# Patient Record
Sex: Male | Born: 1940 | Race: White | Hispanic: No | Marital: Married | Smoking: Former smoker
Health system: Southern US, Community
[De-identification: ages and names within clinical notes are randomized; demographics above are authoritative.]

## PROBLEM LIST (undated history)

## (undated) DIAGNOSIS — I10 Essential (primary) hypertension: Secondary | ICD-10-CM

## (undated) DIAGNOSIS — Z87442 Personal history of urinary calculi: Secondary | ICD-10-CM

## (undated) DIAGNOSIS — C801 Malignant (primary) neoplasm, unspecified: Secondary | ICD-10-CM

## (undated) DIAGNOSIS — M1990 Unspecified osteoarthritis, unspecified site: Secondary | ICD-10-CM

## (undated) DIAGNOSIS — E785 Hyperlipidemia, unspecified: Secondary | ICD-10-CM

## (undated) HISTORY — DX: Hyperlipidemia, unspecified: E78.5

## (undated) HISTORY — DX: Essential (primary) hypertension: I10

## (undated) HISTORY — PX: OTHER SURGICAL HISTORY: SHX169

---

## 1998-01-22 HISTORY — PX: BACK SURGERY: SHX140

## 1998-01-22 HISTORY — PX: HERNIA REPAIR: SHX51

## 2001-05-15 ENCOUNTER — Encounter: Payer: Self-pay | Admitting: Emergency Medicine

## 2001-05-15 ENCOUNTER — Emergency Department (HOSPITAL_COMMUNITY): Admission: EM | Admit: 2001-05-15 | Discharge: 2001-05-15 | Payer: Self-pay | Admitting: Emergency Medicine

## 2005-06-06 ENCOUNTER — Ambulatory Visit (HOSPITAL_COMMUNITY): Admission: RE | Admit: 2005-06-06 | Discharge: 2005-06-06 | Payer: Self-pay | Admitting: Urology

## 2005-06-07 ENCOUNTER — Encounter (INDEPENDENT_AMBULATORY_CARE_PROVIDER_SITE_OTHER): Payer: Self-pay | Admitting: Specialist

## 2005-06-07 ENCOUNTER — Inpatient Hospital Stay (HOSPITAL_COMMUNITY): Admission: RE | Admit: 2005-06-07 | Discharge: 2005-06-08 | Payer: Self-pay | Admitting: Urology

## 2006-01-22 DIAGNOSIS — C801 Malignant (primary) neoplasm, unspecified: Secondary | ICD-10-CM

## 2006-01-22 HISTORY — DX: Malignant (primary) neoplasm, unspecified: C80.1

## 2006-01-22 HISTORY — PX: PROSTATECTOMY: SHX69

## 2006-09-02 ENCOUNTER — Emergency Department (HOSPITAL_COMMUNITY): Admission: EM | Admit: 2006-09-02 | Discharge: 2006-09-02 | Payer: Self-pay | Admitting: Emergency Medicine

## 2007-04-02 ENCOUNTER — Ambulatory Visit: Payer: Self-pay | Admitting: Internal Medicine

## 2007-04-28 ENCOUNTER — Ambulatory Visit: Payer: Self-pay | Admitting: Internal Medicine

## 2007-04-28 ENCOUNTER — Encounter: Payer: Self-pay | Admitting: Internal Medicine

## 2008-10-12 ENCOUNTER — Emergency Department (HOSPITAL_COMMUNITY): Admission: EM | Admit: 2008-10-12 | Discharge: 2008-10-12 | Payer: Self-pay | Admitting: Emergency Medicine

## 2010-04-28 LAB — POCT I-STAT, CHEM 8
BUN: 20 mg/dL (ref 6–23)
Creatinine, Ser: 0.9 mg/dL (ref 0.4–1.5)
Sodium: 139 mEq/L (ref 135–145)
TCO2: 21 mmol/L (ref 0–100)

## 2010-04-28 LAB — DIFFERENTIAL
Eosinophils Relative: 0 % (ref 0–5)
Lymphocytes Relative: 8 % — ABNORMAL LOW (ref 12–46)
Lymphs Abs: 0.8 10*3/uL (ref 0.7–4.0)
Monocytes Relative: 3 % (ref 3–12)

## 2010-04-28 LAB — URINALYSIS, ROUTINE W REFLEX MICROSCOPIC
Glucose, UA: NEGATIVE mg/dL
Protein, ur: NEGATIVE mg/dL
Specific Gravity, Urine: 1.017 (ref 1.005–1.030)
pH: 7.5 (ref 5.0–8.0)

## 2010-04-28 LAB — CBC
HCT: 44.1 % (ref 39.0–52.0)
Hemoglobin: 15.4 g/dL (ref 13.0–17.0)
Platelets: 167 10*3/uL (ref 150–400)
RBC: 4.48 MIL/uL (ref 4.22–5.81)
WBC: 10.5 10*3/uL (ref 4.0–10.5)

## 2010-06-09 NOTE — Op Note (Signed)
NAMEMARCK, MCCLENNY NO.:  [id number]   MEDICAL RECORD NO.:  [id number]          PATIENT TYPE:  INP   LOCATION:  1421                         FACILITY:  [facility]   PHYSICIAN:  Heloise Purpura, MD      DATE OF BIRTH:  12-Apr-1940   DATE OF PROCEDURE:  06/07/2005  DATE OF DISCHARGE:  06/06/2005                                 OPERATIVE REPORT   PREOPERATIVE DIAGNOSIS:  Clinically localized adenocarcinoma of prostate.   POSTOPERATIVE DIAGNOSIS:  Clinically localized adenocarcinoma of prostate.   PROCEDURE:  Robotic assisted laparoscopic radical prostatectomy (bilateral  nerve sparing).   SURGEON:  Dr. Heloise Purpura.   ASSISTANT:  Dr. Garrison Columbus.   COMPLICATIONS:  None.   ANESTHESIA:  General.   ESTIMATED BLOOD LOSS:  Less than 100 mL.   INTRAVENOUS FLUIDS:  1700 mL of lactated Ringer's.   SPECIMEN:  Prostate and seminal vesicles.   DRAINS:  1.  20-French coude catheter.  2.  #19 Blake pelvic drain.   INDICATIONS:  Mr. Stauffer is a 70 year old gentleman with recently diagnosed  clinical stage T1C prostate cancer with a Gleason score of 3+3=6 and a PSA  of 5.15.  His preoperative IPSS score was 2 with an IIEF score of 24.  After  discussing management options for clinically localized prostate cancer, the  patient elected to proceed with the above procedures.  Potential risks and  benefits were discussed with the patient and he consented.   DESCRIPTION OF PROCEDURE:  The patient was taken to the operating room and a  general anesthetic was administered.  He was given preoperative antibiotics,  placed in the dorsal lithotomy position, and prepped and draped in the usual  sterile fashion.  Next a preoperative time-out was performed.  A Foley  catheter was then inserted into the bladder.  A site was selected 18 cm from  the pubic symphysis and just the left of the umbilicus for placement of the  camera port.  This was placed using a standard open Hasson  technique.  This  allowed entry into the peritoneal cavity under direct vision.  A 12 mm port  was then placed and a pneumoperitoneum was established.  A 0 degrees lens  was then used to inspect the abdomen.  There were noted to be no intra-  abdominal injuries.  There were noted to be left lower quadrant adhesions.  Attention then turned to placement of the remaining ports.  Bilateral 8 mm  robotic ports were placed 16 cm from the pubic symphysis and 10 cm lateral  to the camera port.  An additional 8 mm robotic port was placed in the far  left lateral abdominal wall.  5 mm port was placed between the camera port  and the right robotic port.  An additional 12 mm port was placed in the far  right lateral abdominal wall.  All ports were placed under direct vision  without difficulty.  The laparoscopic scissors were then used to take down  the aforementioned left lower quadrant adhesions.  With the aid of the  cautery scissors, and  the bladder was then reflected posteriorly allowing  entry into the space of Retzius and identification of the endopelvic fascia  and prostate.  The endopelvic fascia was then incised the apex back to the  base of the prostate and the underlying levator muscle fibers were swept  laterally off the prostate.  This isolated the dorsal venous complex which  was then ligated with a figure-of-eight 0 Vicryl suture.  In addition, a 0  Vicryl buttressing suture was placed to buttress the dorsal venous complex  up to the pubic symphysis.  The bladder neck was then identified with the  aid of Foley catheter manipulation.  The anterior bladder neck was then  incised and the Foley catheter balloon was deflated allowing the catheter to  be brought into the operative field and used to retract the prostate  anteriorly.  This exposed the posterior bladder neck which was then  similarly divided.  Dissection continued posteriorly until the vasa  deferentia and seminal vesicles were  identified.  The vasa deferentia were  isolated and divided.  The seminal vesicles were isolated with care to clip  the seminal vesicle artery and avoid energy at the tips of the seminal  vesicles.  The seminal vesicles were then lifted anteriorly and the space  between Denonvilliers's fascia and the anterior rectum was bluntly developed  thereby isolating the vascular pedicles of the prostate.  Attention then  turned to the nerve sparing portion of the procedure.  The levator fascia  and lateral prostatic fascia were incised allowing the neurovascular bundles  to be swept laterally and posteriorly off the prostate.  This isolated the  vascular pedicles of the prostate which were then ligated with Hem-o-lok  clips and sharply divided.  The neurovascular bundles were then able to be  separated from the prostate out to the apex.  Attention then turned to the  apex of the prostate.  The dorsal venous complex was divided down to the  urethra.  The urethra was then sharply divided with cold scissors allowing  the prostate to be disarticulated and placed up into the abdomen.  The  pelvis was then copiously irrigated.  With irrigation of the pelvis, air was  injected into the rectal catheter.  There was no evidence of a rectal  injury.  Hemostasis was ensured.  Attention then turned to the urethral  anastomosis.  Double-arm 3-0 Monocryl suture was used to perform a 360  degree running tension-free anastomosis between the bladder neck and  urethra.  A new 20-French coude catheter was then inserted into the bladder.  This catheter was irrigated.  There was no evidence of blood clots within  the bladder and the anastomosis appeared water tight.  A #19 Blake drain was  then brought through the left robotic port and appropriately positioned in  the pelvis.  The surgical cart was then undocked.  The Endopouch retrieval  bag was then used to retrieve the prostate specimen via the camera port incision for  later removal.  A 0 Vicryl stitch was placed through the right  lateral 12 mm port site fascia for port site closure.  All remaining ports  were then removed under direct vision.  The periumbilical port site was  slightly extended inferiorly allowing the prostate specimen to be removed  intact within the Endopouch retrieval bag.  This fascial opening was then  closed with a running 0 Vicryl suture.  0.25% Marcaine was then injected  into all port sites and the skin was  reapproximated at all port sites with  staples.  Sterile dressings were applied.  The patient appeared to tolerate  procedure well and without complications.  All sponge, needle counts were  correct x2 at the end of the procedure.  He was able to be awakened and  transferred to the recovery unit in satisfactory condition.           ______________________________  Heloise Purpura, MD  Electronically Signed     LB/MEDQ  D:  06/07/2005  T:  06/08/2005  Job:  [id number]

## 2010-06-09 NOTE — Discharge Summary (Signed)
NAMESHIGEO, BAUGH NO.:  [id number]   MEDICAL RECORD NO.:  [id number]          PATIENT TYPE:  INP   LOCATION:  1421                         FACILITY:  [facility]   PHYSICIAN:  Heloise Purpura, MD      DATE OF BIRTH:  April 06, 1940   DATE OF ADMISSION:  06/07/2005  DATE OF DISCHARGE:  06/08/2005                                 DISCHARGE SUMMARY   PROCEDURES:   SURGEON:  Heloise Purpura, M.D.   ADMISSION DIAGNOSIS:  Clinically localized adenocarcinoma of prostate.   DISCHARGE DIAGNOSIS:  Clinically localized adenocarcinoma of prostate.   PROCEDURE:  Robotic-assisted laparoscopic radical prostatectomy.   HISTORY:  For full details, please see admission history and physical.  Briefly, Mr. Vivona is a 70 year old gentleman who was recently diagnosed  clinical stage T1c prostate cancer with a Gleason score three 3 + 3 = 6 and  a PSA of 5.15.  After discussing management options, he elected to proceed  with surgical therapy with a robotic-assisted laparoscopic radical  prostatectomy.   HOSPITAL COURSE:  The patient was taken the operating room, on Jun 07, 2005,  and underwent the above procedure.  He tolerated his prostatectomy without  difficulty and was able to be transferred to a regular hospital room  following recovery from anesthesia.  Over the course of the next 24 hours,  he was able to begin ambulating which he did without difficulty.  He was  able to begin a clear liquid diet which he tolerated without problems and  was subsequently transitioned over to an oral pain medication regimen.  By  the afternoon of postoperative day #1, he continued to have excellent urine  output with minimal output from his pelvic drain and his pelvic drain was  removed.  He otherwise was in excellent condition and met all discharge  criteria and was discharged home in excellent condition.   DISPOSITION:  Home.   DISCHARGE MEDICATIONS:  Mr. Haag was instructed to resume his regular  home  medications, excepting for any aspirin, nonsteroidal anti-inflammatory  drugs, or herbal supplements.  He was given a prescription to take Vicodin  as needed for pain and Colace as a stool softener.  He was also given a  prescription to take Cipro beginning one day prior to his return visit for  Foley catheter removal.   DISCHARGE INSTRUCTIONS:  1.  Mr. Sedor was instructed on the signs and symptoms of wound infection      and told to call should he have any problems.  2.  He was instructed on Foley catheter care and given a leg bag for daytime      usage.  3.  He was also instructed to advance his diet gradually, once passing      flatus.  4.  Finally, he was instructed to be ambulatory but to specifically refrain      from any heavy lifting, strenuous activity, or driving.   FOLLOWUP:  1.  Mr. Abdon will follow up with Dr. Retta Diones in approximately one week      for Foley catheter removal as well  as removal of the staples and to      discuss his surgical pathology in further detail.  2.  I will then have Mr. Tiede was see me in approximately six weeks for      further evaluation, as I am, unfortunately, out of town at the end of      next week when he will be returning to the clinic.           ______________________________  Heloise Purpura, MD  Electronically Signed     LB/MEDQ  D:  06/08/2005  T:  06/09/2005  Job:  [id number]   cc:   Bertram Millard. Dahlstedt, M.D.  Fax: [phone]

## 2010-06-09 NOTE — H&P (Signed)
NAMEAUBERT, Jesse NO.:  [id number]   MEDICAL RECORD NO.:  [id number]          PATIENT TYPE:  INP   LOCATION:  0001                         FACILITY:  [facility]   PHYSICIAN:  Heloise Purpura, MD      DATE OF BIRTH:  September 05, 1940   DATE OF ADMISSION:  06/07/2005  DATE OF DISCHARGE:                                HISTORY & PHYSICAL   CHIEF COMPLAINT:  Prostate cancer.   HISTORY:  Jesse Moran is a 70 year old gentleman with clinical stage T1C  prostate cancer with a PSA of 5.15 and Gleason score of 3+3=6.  After a  discussion regarding management options for clinically localized  adenocarcinoma of the prostate the patient elected to proceed with surgical  therapy.  On his preoperative evaluation he was noted to have sclerotic  densities over the 9th and 10th thoracic vertebrae.  For this reason he did  undergo a nuclear medicine bone scan preoperatively as well.  While there  was some uptake in this area the patient did not have any evidence of uptake  elsewhere.  In addition, considering his very low risk prostate cancer it  was felt that this did not represent metastatic disease.  However, this bone  scan will provide useful as a baseline.  The patient did elect to proceed  with surgical therapy.   PAST MEDICAL HISTORY:  1.  Hyperlipidemia.  2.  Hypertension.   PAST SURGICAL HISTORY:  1.  Back surgery.  2.  Right inguinal hernia repair in 1997.   MEDICATIONS:  1.  Caduet 5/10 p.o. daily.  2.  Hydrochlorothiazide 12.5 mg p.o. daily.  3.  Toprol XL 100 mg p.o. daily.  4.  Aspirin 81 mg p.o. daily.  5.  Centrum Silver one tablet p.o. daily.   ALLERGIES:  No known drug allergies.   FAMILY HISTORY:  The patient's father died of renal cell carcinoma.  There  is no history of prostate cancer in the family.   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  He is  married.  He denies tobacco use.  He drinks approximately two glasses of  scotch  per day.   REVIEW OF SYSTEMS:  Negative except for an intentional 30 pound weight loss  over the past six months.   PHYSICAL EXAMINATION:  CONSTITUTIONAL:  Patient is a well-nourished, well-  developed, age appropriate male in no acute distress.  CARDIOVASCULAR:  Regular rate and rhythm without obvious murmurs.  LUNGS:  Clear bilaterally.  ABDOMEN:  Soft, nontender, nondistended without abdominal masses.  GENITOURINARY:  Prostate is without nodularity or induration.   IMPRESSION:  Low risk clinically localized prostate cancer.   PLAN:  Jesse Moran will undergo a robotic-assisted laparoscopic radical  prostatectomy.  He will then be admitted to the hospital for routine  postoperative care.           ______________________________  Heloise Purpura, MD  Electronically Signed     LB/MEDQ  D:  06/07/2005  T:  06/07/2005  Job:  [id number]

## 2010-06-14 ENCOUNTER — Encounter: Payer: Self-pay | Admitting: Internal Medicine

## 2010-11-06 LAB — CBC
Hemoglobin: 15.7
Hemoglobin: 7.8 — CL
MCHC: 35.6
MCHC: 35.9
MCV: 95.5
MCV: 96
RBC: 2.28 — ABNORMAL LOW
RBC: 4.61

## 2010-11-06 LAB — DIFFERENTIAL
Basophils Relative: 0
Basophils Relative: 0
Eosinophils Absolute: 0
Eosinophils Absolute: 0
Lymphocytes Relative: 4 — ABNORMAL LOW
Monocytes Absolute: 0.2
Monocytes Absolute: 0.3
Monocytes Relative: 3
Neutro Abs: 14.5 — ABNORMAL HIGH
Neutrophils Relative %: 93 — ABNORMAL HIGH

## 2010-11-06 LAB — COMPREHENSIVE METABOLIC PANEL
ALT: 32
CO2: 24
Calcium: 8.5
Chloride: 110
GFR calc non Af Amer: 55 — ABNORMAL LOW
Glucose, Bld: 168 — ABNORMAL HIGH
Sodium: 142
Total Bilirubin: 1.5 — ABNORMAL HIGH

## 2010-11-06 LAB — URINALYSIS, ROUTINE W REFLEX MICROSCOPIC
Bilirubin Urine: NEGATIVE
Protein, ur: NEGATIVE
Urobilinogen, UA: 1

## 2011-04-09 DIAGNOSIS — E7800 Pure hypercholesterolemia, unspecified: Secondary | ICD-10-CM | POA: Diagnosis not present

## 2011-04-09 DIAGNOSIS — Z125 Encounter for screening for malignant neoplasm of prostate: Secondary | ICD-10-CM | POA: Diagnosis not present

## 2011-04-09 DIAGNOSIS — I10 Essential (primary) hypertension: Secondary | ICD-10-CM | POA: Diagnosis not present

## 2011-04-16 DIAGNOSIS — Z87442 Personal history of urinary calculi: Secondary | ICD-10-CM | POA: Diagnosis not present

## 2011-04-16 DIAGNOSIS — N529 Male erectile dysfunction, unspecified: Secondary | ICD-10-CM | POA: Diagnosis not present

## 2011-04-16 DIAGNOSIS — I10 Essential (primary) hypertension: Secondary | ICD-10-CM | POA: Diagnosis not present

## 2011-04-16 DIAGNOSIS — Z85828 Personal history of other malignant neoplasm of skin: Secondary | ICD-10-CM | POA: Diagnosis not present

## 2011-04-17 ENCOUNTER — Encounter: Payer: Self-pay | Admitting: Internal Medicine

## 2011-05-23 ENCOUNTER — Ambulatory Visit (AMBULATORY_SURGERY_CENTER): Payer: Medicare Other | Admitting: *Deleted

## 2011-05-23 VITALS — Ht 71.0 in | Wt 211.2 lb

## 2011-05-23 DIAGNOSIS — Z1211 Encounter for screening for malignant neoplasm of colon: Secondary | ICD-10-CM

## 2011-05-23 MED ORDER — MOVIPREP 100 G PO SOLR: ORAL | Status: DC

## 2011-06-06 ENCOUNTER — Ambulatory Visit (AMBULATORY_SURGERY_CENTER): Payer: Medicare Other | Admitting: Internal Medicine

## 2011-06-06 ENCOUNTER — Encounter: Payer: Self-pay | Admitting: Internal Medicine

## 2011-06-06 VITALS — BP 138/72 | HR 52 | Temp 98.0°F | Resp 18 | Ht 71.0 in | Wt 211.0 lb

## 2011-06-06 DIAGNOSIS — E785 Hyperlipidemia, unspecified: Secondary | ICD-10-CM | POA: Diagnosis not present

## 2011-06-06 DIAGNOSIS — Z86010 Personal history of colonic polyps: Secondary | ICD-10-CM | POA: Diagnosis not present

## 2011-06-06 DIAGNOSIS — Z1211 Encounter for screening for malignant neoplasm of colon: Secondary | ICD-10-CM | POA: Diagnosis not present

## 2011-06-06 DIAGNOSIS — D126 Benign neoplasm of colon, unspecified: Secondary | ICD-10-CM

## 2011-06-06 DIAGNOSIS — I10 Essential (primary) hypertension: Secondary | ICD-10-CM | POA: Diagnosis not present

## 2011-06-06 MED ORDER — SODIUM CHLORIDE 0.9 % IV SOLN: 500.0000 mL | INTRAVENOUS | Status: DC

## 2011-06-06 NOTE — Progress Notes (Signed)
Patient did not experience any of the following events: a burn prior to discharge; a fall within the facility; wrong site/side/patient/procedure/implant event; or a hospital transfer or hospital admission upon discharge from the facility. (G8907) Patient did not have preoperative order for IV antibiotic SSI prophylaxis. (G8918)  

## 2011-06-06 NOTE — Op Note (Signed)
[facility] [address] [place], [place]  [address]  COLONOSCOPY PROCEDURE REPORT  PATIENT:  Jesse Moran, Jesse Moran  MR#:  [id number] BIRTHDATE:  1940-02-16, 70 yrs. old  GENDER:  male ENDOSCOPIST:  Wilhemina Bonito. Eda Keys, MD REF. BY:  Surveillance Program Recall, PROCEDURE DATE:  06/06/2011 PROCEDURE:  Colonoscopy with snare polypectomy x 4 ASA CLASS:  Class II INDICATIONS:  history of pre-cancerous (adenomatous) colon polyps, surveillance and high-risk screening ;03-2007 w/ multiple (6) TAs MEDICATIONS:   MAC sedation, administered by CRNA, propofol (Diprivan) 300 mg IV  DESCRIPTION OF PROCEDURE:   After the risks benefits and alternatives of the procedure were thoroughly explained, informed consent was obtained.  Digital rectal exam was performed and revealed no abnormalities.   The LB CF-Q180AL [id number] endoscope was introduced through the anus and advanced to the cecum, which was identified by both the appendix and ileocecal valve, without limitations.  The quality of the prep was excellent, using MoviPrep.  The instrument was then slowly withdrawn as the colon was fully examined. <<PROCEDUREIMAGES>>  FINDINGS:  Four polyps (2-67mm) were found in the ascending colon and snared without cautery. Retrieval was successful. Moderate diverticulosis was found in the left colon.   Retroflexed views in the rectum revealed internal hemorrhoids.    The time to cecum = 2:06  minutes. The scope was then withdrawn in  16:06  minutes from the cecum and the procedure completed.  COMPLICATIONS:  None  ENDOSCOPIC IMPRESSION: 1) Four polyps in the ascending colon - removed 2) Moderate diverticulosis in the left colon 3) Internal hemorrhoids  RECOMMENDATIONS: 1) Repeat Colonoscopy in 3 years.  ______________________________ Wilhemina Bonito. Eda Keys, MD  CC:  Romero Liner, MD;  The Patient  n. eSIGNED:   Wilhemina Bonito. Eda Keys at 06/06/2011 11:03 AM  Jaynie Collins, [phone]

## 2011-06-06 NOTE — Patient Instructions (Signed)

## 2011-06-07 ENCOUNTER — Telehealth: Payer: Self-pay | Admitting: *Deleted

## 2011-06-07 NOTE — Telephone Encounter (Signed)
Pt. Not available, spoke with "Ramond Dial".  She stated that he had slept well, he's tolerating diet, no pain or discomfort, and is passing  Gas.  Encouraged her to have him call office if questions or concerns.

## 2011-06-12 ENCOUNTER — Encounter: Payer: Self-pay | Admitting: Internal Medicine

## 2011-10-15 ENCOUNTER — Ambulatory Visit (INDEPENDENT_AMBULATORY_CARE_PROVIDER_SITE_OTHER): Payer: Medicare Other

## 2011-10-15 DIAGNOSIS — Z23 Encounter for immunization: Secondary | ICD-10-CM

## 2012-04-14 DIAGNOSIS — Z0000 Encounter for general adult medical examination without abnormal findings: Secondary | ICD-10-CM | POA: Diagnosis not present

## 2012-04-14 DIAGNOSIS — Z125 Encounter for screening for malignant neoplasm of prostate: Secondary | ICD-10-CM | POA: Diagnosis not present

## 2012-04-14 DIAGNOSIS — I10 Essential (primary) hypertension: Secondary | ICD-10-CM | POA: Diagnosis not present

## 2012-04-21 DIAGNOSIS — E7800 Pure hypercholesterolemia, unspecified: Secondary | ICD-10-CM | POA: Diagnosis not present

## 2012-04-21 DIAGNOSIS — I10 Essential (primary) hypertension: Secondary | ICD-10-CM | POA: Diagnosis not present

## 2012-04-21 DIAGNOSIS — Z1212 Encounter for screening for malignant neoplasm of rectum: Secondary | ICD-10-CM | POA: Diagnosis not present

## 2012-04-21 DIAGNOSIS — Z0000 Encounter for general adult medical examination without abnormal findings: Secondary | ICD-10-CM | POA: Diagnosis not present

## 2012-04-21 DIAGNOSIS — Z8546 Personal history of malignant neoplasm of prostate: Secondary | ICD-10-CM | POA: Diagnosis not present

## 2012-04-21 DIAGNOSIS — Z86010 Personal history of colonic polyps: Secondary | ICD-10-CM | POA: Diagnosis not present

## 2012-07-22 DIAGNOSIS — R7309 Other abnormal glucose: Secondary | ICD-10-CM | POA: Diagnosis not present

## 2012-07-29 DIAGNOSIS — I10 Essential (primary) hypertension: Secondary | ICD-10-CM | POA: Diagnosis not present

## 2012-08-25 DIAGNOSIS — I10 Essential (primary) hypertension: Secondary | ICD-10-CM | POA: Diagnosis not present

## 2012-10-03 ENCOUNTER — Ambulatory Visit (INDEPENDENT_AMBULATORY_CARE_PROVIDER_SITE_OTHER): Payer: Medicare Other

## 2012-10-03 DIAGNOSIS — Z23 Encounter for immunization: Secondary | ICD-10-CM | POA: Diagnosis not present

## 2013-04-20 DIAGNOSIS — Z0000 Encounter for general adult medical examination without abnormal findings: Secondary | ICD-10-CM | POA: Diagnosis not present

## 2013-04-20 DIAGNOSIS — Z23 Encounter for immunization: Secondary | ICD-10-CM | POA: Diagnosis not present

## 2013-04-20 DIAGNOSIS — I10 Essential (primary) hypertension: Secondary | ICD-10-CM | POA: Diagnosis not present

## 2013-04-20 DIAGNOSIS — E7800 Pure hypercholesterolemia, unspecified: Secondary | ICD-10-CM | POA: Diagnosis not present

## 2013-04-27 DIAGNOSIS — Z125 Encounter for screening for malignant neoplasm of prostate: Secondary | ICD-10-CM | POA: Diagnosis not present

## 2013-04-27 DIAGNOSIS — R7309 Other abnormal glucose: Secondary | ICD-10-CM | POA: Diagnosis not present

## 2013-04-27 DIAGNOSIS — Z8546 Personal history of malignant neoplasm of prostate: Secondary | ICD-10-CM | POA: Diagnosis not present

## 2013-04-27 DIAGNOSIS — E7800 Pure hypercholesterolemia, unspecified: Secondary | ICD-10-CM | POA: Diagnosis not present

## 2013-04-27 DIAGNOSIS — Z1212 Encounter for screening for malignant neoplasm of rectum: Secondary | ICD-10-CM | POA: Diagnosis not present

## 2013-04-27 DIAGNOSIS — L570 Actinic keratosis: Secondary | ICD-10-CM | POA: Diagnosis not present

## 2013-07-09 DIAGNOSIS — E7800 Pure hypercholesterolemia, unspecified: Secondary | ICD-10-CM | POA: Diagnosis not present

## 2013-07-09 DIAGNOSIS — I10 Essential (primary) hypertension: Secondary | ICD-10-CM | POA: Diagnosis not present

## 2013-10-28 ENCOUNTER — Ambulatory Visit (INDEPENDENT_AMBULATORY_CARE_PROVIDER_SITE_OTHER): Payer: Medicare Other | Admitting: Radiology

## 2013-10-28 DIAGNOSIS — Z23 Encounter for immunization: Secondary | ICD-10-CM

## 2013-11-12 DIAGNOSIS — H2513 Age-related nuclear cataract, bilateral: Secondary | ICD-10-CM | POA: Diagnosis not present

## 2013-12-28 DIAGNOSIS — M25561 Pain in right knee: Secondary | ICD-10-CM | POA: Diagnosis not present

## 2013-12-28 DIAGNOSIS — M1711 Unilateral primary osteoarthritis, right knee: Secondary | ICD-10-CM | POA: Diagnosis not present

## 2014-04-28 ENCOUNTER — Encounter: Payer: Self-pay | Admitting: Internal Medicine

## 2014-06-02 DIAGNOSIS — Z0000 Encounter for general adult medical examination without abnormal findings: Secondary | ICD-10-CM | POA: Diagnosis not present

## 2014-06-02 DIAGNOSIS — I10 Essential (primary) hypertension: Secondary | ICD-10-CM | POA: Diagnosis not present

## 2014-06-02 DIAGNOSIS — Z125 Encounter for screening for malignant neoplasm of prostate: Secondary | ICD-10-CM | POA: Diagnosis not present

## 2014-06-02 DIAGNOSIS — E780 Pure hypercholesterolemia: Secondary | ICD-10-CM | POA: Diagnosis not present

## 2014-06-08 DIAGNOSIS — Z1212 Encounter for screening for malignant neoplasm of rectum: Secondary | ICD-10-CM | POA: Diagnosis not present

## 2014-06-08 DIAGNOSIS — I10 Essential (primary) hypertension: Secondary | ICD-10-CM | POA: Diagnosis not present

## 2014-06-08 DIAGNOSIS — E780 Pure hypercholesterolemia: Secondary | ICD-10-CM | POA: Diagnosis not present

## 2014-06-08 DIAGNOSIS — R7309 Other abnormal glucose: Secondary | ICD-10-CM | POA: Diagnosis not present

## 2014-06-08 DIAGNOSIS — Z7982 Long term (current) use of aspirin: Secondary | ICD-10-CM | POA: Diagnosis not present

## 2014-08-13 ENCOUNTER — Encounter: Payer: Self-pay | Admitting: Internal Medicine

## 2014-10-26 ENCOUNTER — Ambulatory Visit (INDEPENDENT_AMBULATORY_CARE_PROVIDER_SITE_OTHER): Payer: Medicare Other

## 2014-10-26 DIAGNOSIS — Z23 Encounter for immunization: Secondary | ICD-10-CM | POA: Diagnosis not present

## 2015-06-09 DIAGNOSIS — Z0000 Encounter for general adult medical examination without abnormal findings: Secondary | ICD-10-CM | POA: Diagnosis not present

## 2015-06-09 DIAGNOSIS — E7800 Pure hypercholesterolemia, unspecified: Secondary | ICD-10-CM | POA: Diagnosis not present

## 2015-06-09 DIAGNOSIS — I10 Essential (primary) hypertension: Secondary | ICD-10-CM | POA: Diagnosis not present

## 2015-06-09 DIAGNOSIS — Z125 Encounter for screening for malignant neoplasm of prostate: Secondary | ICD-10-CM | POA: Diagnosis not present

## 2015-06-15 DIAGNOSIS — K5730 Diverticulosis of large intestine without perforation or abscess without bleeding: Secondary | ICD-10-CM | POA: Diagnosis not present

## 2015-06-15 DIAGNOSIS — R7303 Prediabetes: Secondary | ICD-10-CM | POA: Diagnosis not present

## 2015-06-15 DIAGNOSIS — Z1212 Encounter for screening for malignant neoplasm of rectum: Secondary | ICD-10-CM | POA: Diagnosis not present

## 2015-06-15 DIAGNOSIS — Z87442 Personal history of urinary calculi: Secondary | ICD-10-CM | POA: Diagnosis not present

## 2015-06-15 DIAGNOSIS — Z7982 Long term (current) use of aspirin: Secondary | ICD-10-CM | POA: Diagnosis not present

## 2015-07-04 DIAGNOSIS — M810 Age-related osteoporosis without current pathological fracture: Secondary | ICD-10-CM | POA: Diagnosis not present

## 2015-08-01 DIAGNOSIS — M8580 Other specified disorders of bone density and structure, unspecified site: Secondary | ICD-10-CM | POA: Diagnosis not present

## 2015-08-01 DIAGNOSIS — E559 Vitamin D deficiency, unspecified: Secondary | ICD-10-CM | POA: Diagnosis not present

## 2015-10-24 ENCOUNTER — Ambulatory Visit (INDEPENDENT_AMBULATORY_CARE_PROVIDER_SITE_OTHER): Payer: Medicare Other | Admitting: Physician Assistant

## 2015-10-24 DIAGNOSIS — Z23 Encounter for immunization: Secondary | ICD-10-CM | POA: Diagnosis not present

## 2015-10-24 NOTE — Progress Notes (Signed)
Patient presented here today for flu shot only. Jp/cma

## 2015-11-23 DEATH — deceased

## 2015-12-12 DIAGNOSIS — E7800 Pure hypercholesterolemia, unspecified: Secondary | ICD-10-CM | POA: Diagnosis not present

## 2015-12-12 DIAGNOSIS — R7303 Prediabetes: Secondary | ICD-10-CM | POA: Diagnosis not present

## 2015-12-13 DIAGNOSIS — R7303 Prediabetes: Secondary | ICD-10-CM | POA: Diagnosis not present

## 2015-12-13 DIAGNOSIS — E7800 Pure hypercholesterolemia, unspecified: Secondary | ICD-10-CM | POA: Diagnosis not present

## 2015-12-19 DIAGNOSIS — R7303 Prediabetes: Secondary | ICD-10-CM | POA: Diagnosis not present

## 2015-12-19 DIAGNOSIS — E7800 Pure hypercholesterolemia, unspecified: Secondary | ICD-10-CM | POA: Diagnosis not present

## 2016-04-10 DIAGNOSIS — M1711 Unilateral primary osteoarthritis, right knee: Secondary | ICD-10-CM | POA: Diagnosis not present

## 2016-04-10 DIAGNOSIS — M25562 Pain in left knee: Secondary | ICD-10-CM | POA: Diagnosis not present

## 2016-04-10 DIAGNOSIS — M170 Bilateral primary osteoarthritis of knee: Secondary | ICD-10-CM | POA: Diagnosis not present

## 2016-04-10 DIAGNOSIS — M25561 Pain in right knee: Secondary | ICD-10-CM | POA: Diagnosis not present

## 2016-04-16 DIAGNOSIS — M1711 Unilateral primary osteoarthritis, right knee: Secondary | ICD-10-CM | POA: Diagnosis not present

## 2016-04-16 DIAGNOSIS — M25561 Pain in right knee: Secondary | ICD-10-CM | POA: Diagnosis not present

## 2016-04-23 DIAGNOSIS — M1711 Unilateral primary osteoarthritis, right knee: Secondary | ICD-10-CM | POA: Diagnosis not present

## 2016-04-23 DIAGNOSIS — M25561 Pain in right knee: Secondary | ICD-10-CM | POA: Diagnosis not present

## 2016-04-30 DIAGNOSIS — M25561 Pain in right knee: Secondary | ICD-10-CM | POA: Diagnosis not present

## 2016-04-30 DIAGNOSIS — M1711 Unilateral primary osteoarthritis, right knee: Secondary | ICD-10-CM | POA: Diagnosis not present

## 2016-05-07 DIAGNOSIS — M25561 Pain in right knee: Secondary | ICD-10-CM | POA: Diagnosis not present

## 2016-05-07 DIAGNOSIS — M1711 Unilateral primary osteoarthritis, right knee: Secondary | ICD-10-CM | POA: Diagnosis not present

## 2016-06-19 DIAGNOSIS — I10 Essential (primary) hypertension: Secondary | ICD-10-CM | POA: Diagnosis not present

## 2016-06-19 DIAGNOSIS — Z0000 Encounter for general adult medical examination without abnormal findings: Secondary | ICD-10-CM | POA: Diagnosis not present

## 2016-06-19 DIAGNOSIS — E7800 Pure hypercholesterolemia, unspecified: Secondary | ICD-10-CM | POA: Diagnosis not present

## 2016-06-19 DIAGNOSIS — Z125 Encounter for screening for malignant neoplasm of prostate: Secondary | ICD-10-CM | POA: Diagnosis not present

## 2016-06-21 DIAGNOSIS — E875 Hyperkalemia: Secondary | ICD-10-CM | POA: Diagnosis not present

## 2016-06-21 DIAGNOSIS — Z8546 Personal history of malignant neoplasm of prostate: Secondary | ICD-10-CM | POA: Diagnosis not present

## 2016-06-21 DIAGNOSIS — E7800 Pure hypercholesterolemia, unspecified: Secondary | ICD-10-CM | POA: Diagnosis not present

## 2016-06-21 DIAGNOSIS — I10 Essential (primary) hypertension: Secondary | ICD-10-CM | POA: Diagnosis not present

## 2016-06-21 DIAGNOSIS — Z1212 Encounter for screening for malignant neoplasm of rectum: Secondary | ICD-10-CM | POA: Diagnosis not present

## 2016-06-21 DIAGNOSIS — Z86010 Personal history of colonic polyps: Secondary | ICD-10-CM | POA: Diagnosis not present

## 2016-08-20 DIAGNOSIS — M25561 Pain in right knee: Secondary | ICD-10-CM | POA: Diagnosis not present

## 2016-08-20 DIAGNOSIS — M1711 Unilateral primary osteoarthritis, right knee: Secondary | ICD-10-CM | POA: Diagnosis not present

## 2016-09-10 DIAGNOSIS — M25561 Pain in right knee: Secondary | ICD-10-CM | POA: Diagnosis not present

## 2016-09-10 DIAGNOSIS — M1711 Unilateral primary osteoarthritis, right knee: Secondary | ICD-10-CM | POA: Diagnosis not present

## 2016-09-17 DIAGNOSIS — M25561 Pain in right knee: Secondary | ICD-10-CM | POA: Diagnosis not present

## 2016-09-17 DIAGNOSIS — M1711 Unilateral primary osteoarthritis, right knee: Secondary | ICD-10-CM | POA: Diagnosis not present

## 2016-09-21 DIAGNOSIS — R197 Diarrhea, unspecified: Secondary | ICD-10-CM | POA: Diagnosis not present

## 2016-09-25 DIAGNOSIS — M1711 Unilateral primary osteoarthritis, right knee: Secondary | ICD-10-CM | POA: Diagnosis not present

## 2016-09-25 DIAGNOSIS — M25561 Pain in right knee: Secondary | ICD-10-CM | POA: Diagnosis not present

## 2016-09-26 DIAGNOSIS — A071 Giardiasis [lambliasis]: Secondary | ICD-10-CM | POA: Diagnosis not present

## 2016-11-07 ENCOUNTER — Ambulatory Visit (INDEPENDENT_AMBULATORY_CARE_PROVIDER_SITE_OTHER): Payer: Medicare Other | Admitting: Family Medicine

## 2016-11-07 DIAGNOSIS — Z23 Encounter for immunization: Secondary | ICD-10-CM | POA: Diagnosis not present

## 2016-11-18 NOTE — Progress Notes (Signed)
Immunization encounter only. 

## 2017-01-01 DIAGNOSIS — M25561 Pain in right knee: Secondary | ICD-10-CM | POA: Diagnosis not present

## 2017-01-01 DIAGNOSIS — Z9229 Personal history of other drug therapy: Secondary | ICD-10-CM | POA: Diagnosis not present

## 2017-01-01 DIAGNOSIS — M1711 Unilateral primary osteoarthritis, right knee: Secondary | ICD-10-CM | POA: Diagnosis not present

## 2017-01-09 DIAGNOSIS — M1711 Unilateral primary osteoarthritis, right knee: Secondary | ICD-10-CM | POA: Diagnosis not present

## 2017-01-09 DIAGNOSIS — M25561 Pain in right knee: Secondary | ICD-10-CM | POA: Diagnosis not present

## 2017-01-18 DIAGNOSIS — M25561 Pain in right knee: Secondary | ICD-10-CM | POA: Diagnosis not present

## 2017-01-18 DIAGNOSIS — M1711 Unilateral primary osteoarthritis, right knee: Secondary | ICD-10-CM | POA: Diagnosis not present

## 2017-01-25 DIAGNOSIS — M25561 Pain in right knee: Secondary | ICD-10-CM | POA: Diagnosis not present

## 2017-01-25 DIAGNOSIS — M1711 Unilateral primary osteoarthritis, right knee: Secondary | ICD-10-CM | POA: Diagnosis not present

## 2017-03-05 DIAGNOSIS — H2513 Age-related nuclear cataract, bilateral: Secondary | ICD-10-CM | POA: Diagnosis not present

## 2017-04-30 DIAGNOSIS — Z789 Other specified health status: Secondary | ICD-10-CM | POA: Diagnosis not present

## 2017-04-30 DIAGNOSIS — M25561 Pain in right knee: Secondary | ICD-10-CM | POA: Diagnosis not present

## 2017-04-30 DIAGNOSIS — R269 Unspecified abnormalities of gait and mobility: Secondary | ICD-10-CM | POA: Diagnosis not present

## 2017-04-30 DIAGNOSIS — M1711 Unilateral primary osteoarthritis, right knee: Secondary | ICD-10-CM | POA: Diagnosis not present

## 2017-05-08 DIAGNOSIS — M1711 Unilateral primary osteoarthritis, right knee: Secondary | ICD-10-CM | POA: Diagnosis not present

## 2017-05-08 DIAGNOSIS — M25561 Pain in right knee: Secondary | ICD-10-CM | POA: Diagnosis not present

## 2017-05-14 DIAGNOSIS — M25561 Pain in right knee: Secondary | ICD-10-CM | POA: Diagnosis not present

## 2017-05-14 DIAGNOSIS — M1711 Unilateral primary osteoarthritis, right knee: Secondary | ICD-10-CM | POA: Diagnosis not present

## 2017-05-29 DIAGNOSIS — M1711 Unilateral primary osteoarthritis, right knee: Secondary | ICD-10-CM | POA: Diagnosis not present

## 2017-05-29 DIAGNOSIS — M25561 Pain in right knee: Secondary | ICD-10-CM | POA: Diagnosis not present

## 2017-06-05 DIAGNOSIS — M8580 Other specified disorders of bone density and structure, unspecified site: Secondary | ICD-10-CM | POA: Diagnosis not present

## 2017-06-05 DIAGNOSIS — Z125 Encounter for screening for malignant neoplasm of prostate: Secondary | ICD-10-CM | POA: Diagnosis not present

## 2017-06-05 DIAGNOSIS — Z01818 Encounter for other preprocedural examination: Secondary | ICD-10-CM | POA: Diagnosis not present

## 2017-06-05 DIAGNOSIS — K219 Gastro-esophageal reflux disease without esophagitis: Secondary | ICD-10-CM | POA: Diagnosis not present

## 2017-06-05 DIAGNOSIS — I10 Essential (primary) hypertension: Secondary | ICD-10-CM | POA: Diagnosis not present

## 2017-06-05 DIAGNOSIS — Z7982 Long term (current) use of aspirin: Secondary | ICD-10-CM | POA: Diagnosis not present

## 2017-06-05 DIAGNOSIS — K5730 Diverticulosis of large intestine without perforation or abscess without bleeding: Secondary | ICD-10-CM | POA: Diagnosis not present

## 2017-06-05 DIAGNOSIS — E7800 Pure hypercholesterolemia, unspecified: Secondary | ICD-10-CM | POA: Diagnosis not present

## 2017-06-06 DIAGNOSIS — M25561 Pain in right knee: Secondary | ICD-10-CM | POA: Diagnosis not present

## 2017-06-19 ENCOUNTER — Encounter: Payer: Self-pay | Admitting: Family Medicine

## 2017-06-24 DIAGNOSIS — M8580 Other specified disorders of bone density and structure, unspecified site: Secondary | ICD-10-CM | POA: Diagnosis not present

## 2017-06-24 DIAGNOSIS — Z01818 Encounter for other preprocedural examination: Secondary | ICD-10-CM | POA: Diagnosis not present

## 2017-06-24 DIAGNOSIS — Z7982 Long term (current) use of aspirin: Secondary | ICD-10-CM | POA: Diagnosis not present

## 2017-06-24 DIAGNOSIS — E559 Vitamin D deficiency, unspecified: Secondary | ICD-10-CM | POA: Diagnosis not present

## 2017-06-24 DIAGNOSIS — I10 Essential (primary) hypertension: Secondary | ICD-10-CM | POA: Diagnosis not present

## 2017-06-24 DIAGNOSIS — Z125 Encounter for screening for malignant neoplasm of prostate: Secondary | ICD-10-CM | POA: Diagnosis not present

## 2017-06-24 DIAGNOSIS — E7800 Pure hypercholesterolemia, unspecified: Secondary | ICD-10-CM | POA: Diagnosis not present

## 2017-06-27 DIAGNOSIS — E7800 Pure hypercholesterolemia, unspecified: Secondary | ICD-10-CM | POA: Diagnosis not present

## 2017-06-27 DIAGNOSIS — Z09 Encounter for follow-up examination after completed treatment for conditions other than malignant neoplasm: Secondary | ICD-10-CM | POA: Diagnosis not present

## 2017-06-27 DIAGNOSIS — R7303 Prediabetes: Secondary | ICD-10-CM | POA: Diagnosis not present

## 2017-06-27 DIAGNOSIS — I10 Essential (primary) hypertension: Secondary | ICD-10-CM | POA: Diagnosis not present

## 2017-06-27 DIAGNOSIS — Z87442 Personal history of urinary calculi: Secondary | ICD-10-CM | POA: Diagnosis not present

## 2017-06-27 DIAGNOSIS — Z6830 Body mass index (BMI) 30.0-30.9, adult: Secondary | ICD-10-CM | POA: Diagnosis not present

## 2017-06-27 DIAGNOSIS — N529 Male erectile dysfunction, unspecified: Secondary | ICD-10-CM | POA: Diagnosis not present

## 2017-06-27 DIAGNOSIS — Z0000 Encounter for general adult medical examination without abnormal findings: Secondary | ICD-10-CM | POA: Diagnosis not present

## 2017-06-27 DIAGNOSIS — Z7982 Long term (current) use of aspirin: Secondary | ICD-10-CM | POA: Diagnosis not present

## 2017-06-27 DIAGNOSIS — H9190 Unspecified hearing loss, unspecified ear: Secondary | ICD-10-CM | POA: Diagnosis not present

## 2017-06-27 DIAGNOSIS — Z85828 Personal history of other malignant neoplasm of skin: Secondary | ICD-10-CM | POA: Diagnosis not present

## 2017-06-27 DIAGNOSIS — K219 Gastro-esophageal reflux disease without esophagitis: Secondary | ICD-10-CM | POA: Diagnosis not present

## 2017-07-01 NOTE — H&P (Signed)
TOTAL KNEE ADMISSION H&P  Patient is being admitted for right total knee arthroplasty.  Subjective:  Chief Complaint:    Right knee primary OA / pain  HPI: Jesse Moran, 77 y.o. male, has a history of pain and functional disability in the right knee due to arthritis and has failed non-surgical conservative treatments for greater than 12 weeks to includeNSAID's and/or analgesics, corticosteriod injections, viscosupplementation injections, use of assistive devices and activity modification.  Onset of symptoms was gradual, starting 4+ years ago with gradually worsening course since that time. The patient noted no past surgery on the right knee(s).  Patient currently rates pain in the right knee(s) at 8 out of 10 with activity. Patient has night pain, worsening of pain with activity and weight bearing, pain that interferes with activities of daily living, pain with passive range of motion, crepitus and joint swelling.  Patient has evidence of periarticular osteophytes and joint space narrowing by imaging studies. There is no active infection.  Risks, benefits and expectations were discussed with the patient.  Risks including but not limited to the risk of anesthesia, blood clots, nerve damage, blood vessel damage, failure of the prosthesis, infection and up to and including death.  Patient understand the risks, benefits and expectations and wishes to proceed with surgery.   PCP: Deland Pretty, MD  D/C Plans:       Home   Post-op Meds:       No Rx given   Tranexamic Acid:      To be given - IV   Decadron:      Is to be given  FYI:      ASA  Norco  DME:   Pt already has equipment  PT:   OPPT Rx given    Past Medical History:  Diagnosis Date  . Hyperlipidemia   . Hypertension     Past Surgical History:  Procedure Laterality Date  . BACK SURGERY  2000  . colonoscopy  04/28/2007  . HERNIA REPAIR  2000    rt. groin  . PROSTATECTOMY  2008   malignant    No current facility-administered  medications for this encounter.    Current Outpatient Medications  Medication Sig Dispense Refill Last Dose  . acetaminophen (TYLENOL) 500 MG tablet Take 1,000 mg by mouth 2 (two) times daily as needed for moderate pain or headache.     Marland Kitchen amLODipine (NORVASC) 5 MG tablet Take 1 tablet by mouth Daily. 5 mg daily   06/05/2011  . aspirin EC 81 MG tablet Take 81 mg by mouth daily.   06-04-11  . atorvastatin (LIPITOR) 40 MG tablet Take 20 mg by mouth Daily.    06/05/2011  . Cholecalciferol (VITAMIN D) 2000 units tablet Take 2,000 Units by mouth daily.     Marland Kitchen losartan (COZAAR) 100 MG tablet Take 100 mg by mouth daily.     . metoprolol succinate (TOPROL-XL) 100 MG 24 hr tablet Take 100 mg by mouth Daily.    06/05/2011  . Multiple Vitamins-Minerals (MULTIVITAMIN WITH MINERALS) tablet Take 1 tablet by mouth daily.   06/05/2011  . OVER THE COUNTER MEDICATION Take 2 tablets by mouth 3 (three) times daily. Bone up otc supplement     . Thiamine HCl (VITAMIN B-1) 250 MG tablet Take 250 mg by mouth daily.     . traMADol (ULTRAM) 50 MG tablet Take 50 mg by mouth 2 (two) times daily as needed for severe pain.     . vitamin B-12 (CYANOCOBALAMIN)  500 MCG tablet Take 500 mcg by mouth daily.      Allergies  Allergen Reactions  . Gluten Meal     Abdominal pain    Social History   Tobacco Use  . Smoking status: Former Smoker    Last attempt to quit: 01/22/1958    Years since quitting: 59.4  . Smokeless tobacco: Never Used  Substance Use Topics  . Alcohol use: Yes    Alcohol/week: 1.5 oz    Types: 3 drink(s) per week    Family History  Problem Relation Age of Onset  . Kidney disease Father      Review of Systems  Constitutional: Negative.   HENT: Negative.   Eyes: Negative.   Respiratory: Negative.   Cardiovascular: Negative.   Gastrointestinal: Positive for heartburn.  Genitourinary: Positive for frequency.  Musculoskeletal: Positive for joint pain.  Skin: Negative.   Neurological: Negative.    Endo/Heme/Allergies: Negative.   Psychiatric/Behavioral: Negative.     Objective:  Physical Exam  Constitutional: He is oriented to person, place, and time. He appears well-developed.  HENT:  Head: Normocephalic.  Eyes: Pupils are equal, round, and reactive to light.  Neck: Neck supple. No JVD present. No tracheal deviation present. No thyromegaly present.  Cardiovascular: Normal rate, regular rhythm and intact distal pulses.  Respiratory: Effort normal and breath sounds normal. No respiratory distress. He has no wheezes.  GI: Soft. There is no tenderness. There is no guarding.  Musculoskeletal:       Right knee: He exhibits decreased range of motion, swelling and bony tenderness. He exhibits no ecchymosis, no deformity, no laceration and no erythema. Tenderness found.  Lymphadenopathy:    He has no cervical adenopathy.  Neurological: He is alert and oriented to person, place, and time.  Skin: Skin is warm and dry.  Psychiatric: He has a normal mood and affect.      Labs:  Estimated body mass index is 29.43 kg/m as calculated from the following:   Height as of 06/06/11: 5\' 11"  (1.803 m).   Weight as of 06/06/11: 95.7 kg (211 lb).   Imaging Review Plain radiographs demonstrate severe degenerative joint disease of the right knee.  The bone quality appears to be good for age and reported activity level.   Preoperative templating of the joint replacement has been completed, documented, and submitted to the Operating Room personnel in order to optimize intra-operative equipment management.   Anticipated LOS equal to or greater than 2 midnights due to - Age 58 and older with one or more of the following:  - Obesity  - Expected need for hospital services (PT, OT, Nursing) required for safe  discharge    Assessment/Plan:  End stage arthritis, right knee   The patient history, physical examination, clinical judgment of the provider and imaging studies are consistent with  end stage degenerative joint disease of the right knee(s) and total knee arthroplasty is deemed medically necessary. The treatment options including medical management, injection therapy arthroscopy and arthroplasty were discussed at length. The risks and benefits of total knee arthroplasty were presented and reviewed. The risks due to aseptic loosening, infection, stiffness, patella tracking problems, thromboembolic complications and other imponderables were discussed. The patient acknowledged the explanation, agreed to proceed with the plan and consent was signed. Patient is being admitted for inpatient treatment for surgery, pain control, PT, OT, prophylactic antibiotics, VTE prophylaxis, progressive ambulation and ADL's and discharge planning. The patient is planning to be discharged home.    West Pugh  Rhett Najera   PA-C  07/01/2017, 12:53 PM

## 2017-07-04 ENCOUNTER — Other Ambulatory Visit (HOSPITAL_COMMUNITY): Payer: Self-pay | Admitting: *Deleted

## 2017-07-04 NOTE — Patient Instructions (Addendum)
Jesse Moran  07/04/2017   Your procedure is scheduled on: 07-09-17  Report to [facility] Main  Entrance  Report to admitting at  845 am    Call this number if you have problems the morning of surgery [phone]   Remember: Do not eat food or drink liquids :After Midnight.     Take these medicines the morning of surgery with A SIP OF WATER:  ATORVASTATIN, (LIPITOR), METOPROLOL, AMLODIPINE (NORVASC), ES TYLENOL IF NEEDED              You may not have any metal on your body including hair pins and              piercings  Do not wear jewelry, make-up, lotions, powders or perfumes, deodorant             Do not wear nail polish.  Do not shave  48 hours prior to surgery.              Men may shave face and neck.   Do not bring valuables to the hospital. [place].  Contacts, dentures or bridgework may not be worn into surgery.  Leave suitcase in the car. After surgery it may be brought to your room.                  Please read over the following fact sheets you were given: _____________________________________________________________________             [facility] - Preparing for Surgery Before surgery, you can play an important role.  Because skin is not sterile, your skin needs to be as free of germs as possible.  You can reduce the number of germs on your skin by washing with CHG (chlorahexidine gluconate) soap before surgery.  CHG is an antiseptic cleaner which kills germs and bonds with the skin to continue killing germs even after washing. Please DO NOT use if you have an allergy to CHG or antibacterial soaps.  If your skin becomes reddened/irritated stop using the CHG and inform your nurse when you arrive at Short Stay. Do not shave (including legs and underarms) for at least 48 hours prior to the first CHG shower.  You may shave your face/neck. Please follow these instructions carefully:  1.  Shower  with CHG Soap the night before surgery and the  morning of Surgery.  2.  If you choose to wash your hair, wash your hair first as usual with your  normal  shampoo.  3.  After you shampoo, rinse your hair and body thoroughly to remove the  shampoo.                           4.  Use CHG as you would any other liquid soap.  You can apply chg directly  to the skin and wash                       Gently with a scrungie or clean washcloth.  5.  Apply the CHG Soap to your body ONLY FROM THE NECK DOWN.   Do not use on face/ open  Wound or open sores. Avoid contact with eyes, ears mouth and genitals (private parts).                       Wash face,  Genitals (private parts) with your normal soap.             6.  Wash thoroughly, paying special attention to the area where your surgery  will be performed.  7.  Thoroughly rinse your body with warm water from the neck down.  8.  DO NOT shower/wash with your normal soap after using and rinsing off  the CHG Soap.                9.  Pat yourself dry with a clean towel.            10.  Wear clean pajamas.            11.  Place clean sheets on your bed the night of your first shower and do not  sleep with pets. Day of Surgery : Do not apply any lotions/deodorants the morning of surgery.  Please wear clean clothes to the hospital/surgery center.  FAILURE TO FOLLOW THESE INSTRUCTIONS MAY RESULT IN THE CANCELLATION OF YOUR SURGERY PATIENT SIGNATURE_________________________________  NURSE SIGNATURE__________________________________  ________________________________________________________________________   Jesse Moran  An incentive spirometer is a tool that can help keep your lungs clear and active. This tool measures how well you are filling your lungs with each breath. Taking long deep breaths may help reverse or decrease the chance of developing breathing (pulmonary) problems (especially infection) following:  A long period  of time when you are unable to move or be active. BEFORE THE PROCEDURE   If the spirometer includes an indicator to show your best effort, your nurse or respiratory therapist will set it to a desired goal.  If possible, sit up straight or lean slightly forward. Try not to slouch.  Hold the incentive spirometer in an upright position. INSTRUCTIONS FOR USE  1. Sit on the edge of your bed if possible, or sit up as far as you can in bed or on a chair. 2. Hold the incentive spirometer in an upright position. 3. Breathe out normally. 4. Place the mouthpiece in your mouth and seal your lips tightly around it. 5. Breathe in slowly and as deeply as possible, raising the piston or the ball toward the top of the column. 6. Hold your breath for 3-5 seconds or for as long as possible. Allow the piston or ball to fall to the bottom of the column. 7. Remove the mouthpiece from your mouth and breathe out normally. 8. Rest for a few seconds and repeat Steps 1 through 7 at least 10 times every 1-2 hours when you are awake. Take your time and take a few normal breaths between deep breaths. 9. The spirometer may include an indicator to show your best effort. Use the indicator as a goal to work toward during each repetition. 10. After each set of 10 deep breaths, practice coughing to be sure your lungs are clear. If you have an incision (the cut made at the time of surgery), support your incision when coughing by placing a pillow or rolled up towels firmly against it. Once you are able to get out of bed, walk around indoors and cough well. You may stop using the incentive spirometer when instructed by your caregiver.  RISKS AND COMPLICATIONS  Take your time so you do not get  dizzy or light-headed.  If you are in pain, you may need to take or ask for pain medication before doing incentive spirometry. It is harder to take a deep breath if you are having pain. AFTER USE  Rest and breathe slowly and easily.  It  can be helpful to keep track of a log of your progress. Your caregiver can provide you with a simple table to help with this. If you are using the spirometer at home, follow these instructions: [place] IF:   You are having difficultly using the spirometer.  You have trouble using the spirometer as often as instructed.  Your pain medication is not giving enough relief while using the spirometer.  You develop fever of 100.5 F (38.1 C) or higher. SEEK IMMEDIATE MEDICAL CARE IF:   You cough up bloody sputum that had not been present before.  You develop fever of 102 F (38.9 C) or greater.  You develop worsening pain at or near the incision site. MAKE SURE YOU:   Understand these instructions.  Will watch your condition.  Will get help right away if you are not doing well or get worse. Document Released: 05/21/2006 Document Revised: 04/02/2011 Document Reviewed: 07/22/2006 ExitCare Patient Information 2014 ExitCare, [place].   ________________________________________________________________________  WHAT IS A BLOOD TRANSFUSION? Blood Transfusion Information  A transfusion is the replacement of blood or some of its parts. Blood is made up of multiple cells which provide different functions.  Red blood cells carry oxygen and are used for blood loss replacement.  White blood cells fight against infection.  Platelets control bleeding.  Plasma helps clot blood.  Other blood products are available for specialized needs, such as hemophilia or other clotting disorders. BEFORE THE TRANSFUSION  Who gives blood for transfusions?   Healthy volunteers who are fully evaluated to make sure their blood is safe. This is blood bank blood. Transfusion therapy is the safest it has ever been in the practice of medicine. Before blood is taken from a donor, a complete history is taken to make sure that person has no history of diseases nor engages in risky social behavior (examples  are intravenous drug use or sexual activity with multiple partners). The donor's travel history is screened to minimize risk of transmitting infections, such as malaria. The donated blood is tested for signs of infectious diseases, such as HIV and hepatitis. The blood is then tested to be sure it is compatible with you in order to minimize the chance of a transfusion reaction. If you or a relative donates blood, this is often done in anticipation of surgery and is not appropriate for emergency situations. It takes many days to process the donated blood. RISKS AND COMPLICATIONS Although transfusion therapy is very safe and saves many lives, the main dangers of transfusion include:   Getting an infectious disease.  Developing a transfusion reaction. This is an allergic reaction to something in the blood you were given. Every precaution is taken to prevent this. The decision to have a blood transfusion has been considered carefully by your caregiver before blood is given. Blood is not given unless the benefits outweigh the risks. AFTER THE TRANSFUSION  Right after receiving a blood transfusion, you will usually feel much better and more energetic. This is especially true if your red blood cells have gotten low (anemic). The transfusion raises the level of the red blood cells which carry oxygen, and this usually causes an energy increase.  The nurse administering the transfusion will  monitor you carefully for complications. HOME CARE INSTRUCTIONS  No special instructions are needed after a transfusion. You may find your energy is better. Speak with your caregiver about any limitations on activity for underlying diseases you may have. SEEK MEDICAL CARE IF:   Your condition is not improving after your transfusion.  You develop redness or irritation at the intravenous (IV) site. SEEK IMMEDIATE MEDICAL CARE IF:  Any of the following symptoms occur over the next 12 hours:  Shaking chills.  You have a  temperature by mouth above 102 F (38.9 C), not controlled by medicine.  Chest, back, or muscle pain.  People around you feel you are not acting correctly or are confused.  Shortness of breath or difficulty breathing.  Dizziness and fainting.  You get a rash or develop hives.  You have a decrease in urine output.  Your urine turns a dark color or changes to pink, red, or brown. Any of the following symptoms occur over the next 10 days:  You have a temperature by mouth above 102 F (38.9 C), not controlled by medicine.  Shortness of breath.  Weakness after normal activity.  The white part of the eye turns yellow (jaundice).  You have a decrease in the amount of urine or are urinating less often.  Your urine turns a dark color or changes to pink, red, or brown. Document Released: 01/06/2000 Document Revised: 04/02/2011 Document Reviewed: 08/25/2007 [facility] Patient Information 2014 [place], [place].  _______________________________________________________________________

## 2017-07-04 NOTE — Progress Notes (Signed)
MEDICAL CLEARANCE DR [facility] ON CHART

## 2017-07-05 ENCOUNTER — Encounter (HOSPITAL_COMMUNITY): Payer: Self-pay

## 2017-07-05 ENCOUNTER — Encounter (HOSPITAL_COMMUNITY)
Admission: RE | Admit: 2017-07-05 | Discharge: 2017-07-05 | Disposition: A | Discharge status: Home / Self Care | Payer: Medicare Other | Source: Ambulatory Visit | Attending: Orthopedic Surgery | Admitting: Orthopedic Surgery

## 2017-07-05 ENCOUNTER — Other Ambulatory Visit: Payer: Self-pay

## 2017-07-05 ENCOUNTER — Other Ambulatory Visit: Payer: Self-pay | Admitting: Orthopedic Surgery

## 2017-07-05 DIAGNOSIS — E785 Hyperlipidemia, unspecified: Secondary | ICD-10-CM | POA: Diagnosis not present

## 2017-07-05 DIAGNOSIS — Z7982 Long term (current) use of aspirin: Secondary | ICD-10-CM | POA: Diagnosis not present

## 2017-07-05 DIAGNOSIS — I10 Essential (primary) hypertension: Secondary | ICD-10-CM | POA: Diagnosis not present

## 2017-07-05 DIAGNOSIS — Z79891 Long term (current) use of opiate analgesic: Secondary | ICD-10-CM | POA: Insufficient documentation

## 2017-07-05 DIAGNOSIS — R9431 Abnormal electrocardiogram [ECG] [EKG]: Secondary | ICD-10-CM | POA: Diagnosis not present

## 2017-07-05 DIAGNOSIS — Z01810 Encounter for preprocedural cardiovascular examination: Secondary | ICD-10-CM | POA: Insufficient documentation

## 2017-07-05 DIAGNOSIS — Z79899 Other long term (current) drug therapy: Secondary | ICD-10-CM | POA: Insufficient documentation

## 2017-07-05 DIAGNOSIS — R001 Bradycardia, unspecified: Secondary | ICD-10-CM | POA: Insufficient documentation

## 2017-07-05 DIAGNOSIS — Z87891 Personal history of nicotine dependence: Secondary | ICD-10-CM | POA: Diagnosis not present

## 2017-07-05 DIAGNOSIS — Z01812 Encounter for preprocedural laboratory examination: Secondary | ICD-10-CM | POA: Insufficient documentation

## 2017-07-05 DIAGNOSIS — M1711 Unilateral primary osteoarthritis, right knee: Secondary | ICD-10-CM | POA: Insufficient documentation

## 2017-07-05 HISTORY — DX: Unspecified osteoarthritis, unspecified site: M19.90

## 2017-07-05 HISTORY — DX: Malignant (primary) neoplasm, unspecified: C80.1

## 2017-07-05 HISTORY — DX: Personal history of urinary calculi: Z87.442

## 2017-07-05 LAB — BASIC METABOLIC PANEL
Anion gap: 5 (ref 5–15)
BUN: 21 mg/dL — ABNORMAL HIGH (ref 6–20)
CHLORIDE: 110 mmol/L (ref 101–111)
CO2: 27 mmol/L (ref 22–32)
Calcium: 9.1 mg/dL (ref 8.9–10.3)
Creatinine, Ser: 0.83 mg/dL (ref 0.61–1.24)
GFR calc Af Amer: >60 mL/min (ref >60)
GFR calc non Af Amer: >60 mL/min (ref >60)
GLUCOSE: 111 mg/dL — AB (ref 65–99)
POTASSIUM: 5 mmol/L (ref 3.5–5.1)
Sodium: 142 mmol/L (ref 135–145)

## 2017-07-05 LAB — CBC
HEMATOCRIT: 44.5 % (ref 39.0–52.0)
Hemoglobin: 15 g/dL (ref 13.0–17.0)
MCH: 33.3 pg (ref 26.0–34.0)
MCHC: 33.7 g/dL (ref 30.0–36.0)
MCV: 98.9 fL (ref 78.0–100.0)
Platelets: 197 10*3/uL (ref 150–400)
RBC: 4.5 MIL/uL (ref 4.22–5.81)
RDW: 13.4 % (ref 11.5–15.5)
WBC: 6.3 10*3/uL (ref 4.0–10.5)

## 2017-07-05 LAB — SURGICAL PCR SCREEN
MRSA, PCR: NEGATIVE
Staphylococcus aureus: NEGATIVE

## 2017-07-05 NOTE — Care Plan (Signed)
Ortho Bundle R TKA scheduled on 07-09-17 DCP:  Home with spouse.  1 story/1 ste DME:  No needs. PT:  [facility].  PT eval scheduled on 07-15-17.

## 2017-07-08 MED ORDER — TRANEXAMIC ACID 1000 MG/10ML IV SOLN
1000.0000 mg | INTRAVENOUS | Status: AC
  Administered 2017-07-09: 1000 mg via INTRAVENOUS
  Filled 2017-07-08: qty 1100

## 2017-07-09 ENCOUNTER — Encounter (HOSPITAL_COMMUNITY): Payer: Self-pay

## 2017-07-09 ENCOUNTER — Inpatient Hospital Stay (HOSPITAL_COMMUNITY)
Admission: RE | Admit: 2017-07-09 | Discharge: 2017-07-11 | DRG: 470 | Disposition: A | Discharge status: Home / Self Care | Payer: Medicare Other | Attending: Orthopedic Surgery | Admitting: Orthopedic Surgery

## 2017-07-09 ENCOUNTER — Encounter (HOSPITAL_COMMUNITY)
Admission: RE | Disposition: A | Discharge status: Home / Self Care | Payer: Self-pay | Source: Home / Self Care | Attending: Orthopedic Surgery

## 2017-07-09 ENCOUNTER — Other Ambulatory Visit: Payer: Self-pay

## 2017-07-09 ENCOUNTER — Inpatient Hospital Stay (HOSPITAL_COMMUNITY): Payer: Medicare Other | Admitting: Anesthesiology

## 2017-07-09 DIAGNOSIS — Z9079 Acquired absence of other genital organ(s): Secondary | ICD-10-CM

## 2017-07-09 DIAGNOSIS — E785 Hyperlipidemia, unspecified: Secondary | ICD-10-CM | POA: Diagnosis present

## 2017-07-09 DIAGNOSIS — Z6829 Body mass index (BMI) 29.0-29.9, adult: Secondary | ICD-10-CM

## 2017-07-09 DIAGNOSIS — I10 Essential (primary) hypertension: Secondary | ICD-10-CM | POA: Diagnosis not present

## 2017-07-09 DIAGNOSIS — R12 Heartburn: Secondary | ICD-10-CM | POA: Diagnosis present

## 2017-07-09 DIAGNOSIS — G8918 Other acute postprocedural pain: Secondary | ICD-10-CM | POA: Diagnosis not present

## 2017-07-09 DIAGNOSIS — E663 Overweight: Secondary | ICD-10-CM | POA: Diagnosis not present

## 2017-07-09 DIAGNOSIS — Z79899 Other long term (current) drug therapy: Secondary | ICD-10-CM | POA: Diagnosis not present

## 2017-07-09 DIAGNOSIS — M1711 Unilateral primary osteoarthritis, right knee: Secondary | ICD-10-CM | POA: Diagnosis not present

## 2017-07-09 DIAGNOSIS — Z8546 Personal history of malignant neoplasm of prostate: Secondary | ICD-10-CM

## 2017-07-09 DIAGNOSIS — M659 Synovitis and tenosynovitis, unspecified: Secondary | ICD-10-CM | POA: Diagnosis present

## 2017-07-09 DIAGNOSIS — Z7982 Long term (current) use of aspirin: Secondary | ICD-10-CM

## 2017-07-09 DIAGNOSIS — Z87891 Personal history of nicotine dependence: Secondary | ICD-10-CM | POA: Diagnosis not present

## 2017-07-09 DIAGNOSIS — Z91018 Allergy to other foods: Secondary | ICD-10-CM

## 2017-07-09 DIAGNOSIS — Z87442 Personal history of urinary calculi: Secondary | ICD-10-CM | POA: Diagnosis not present

## 2017-07-09 DIAGNOSIS — Z96651 Presence of right artificial knee joint: Secondary | ICD-10-CM

## 2017-07-09 DIAGNOSIS — Z96659 Presence of unspecified artificial knee joint: Secondary | ICD-10-CM

## 2017-07-09 HISTORY — PX: TOTAL KNEE ARTHROPLASTY: SHX125

## 2017-07-09 LAB — TYPE AND SCREEN
ABO/RH(D): O POS
ANTIBODY SCREEN: NEGATIVE

## 2017-07-09 SURGERY — ARTHROPLASTY, KNEE, TOTAL
Anesthesia: Spinal | Site: Knee | Laterality: Right

## 2017-07-09 MED ORDER — METHOCARBAMOL 500 MG PO TABS: 500.0000 mg | ORAL_TABLET | Freq: Four times a day (QID) | ORAL | 0 refills | Status: DC | PRN

## 2017-07-09 MED ORDER — LOSARTAN POTASSIUM 50 MG PO TABS
100.0000 mg | ORAL_TABLET | Freq: Every day | ORAL | Status: DC
  Administered 2017-07-09 – 2017-07-11 (×3): 100 mg via ORAL
  Filled 2017-07-09 (×3): qty 2

## 2017-07-09 MED ORDER — SODIUM CHLORIDE 0.9 % IJ SOLN
INTRAMUSCULAR | Status: AC
  Filled 2017-07-09: qty 50

## 2017-07-09 MED ORDER — ATORVASTATIN CALCIUM 20 MG PO TABS
20.0000 mg | ORAL_TABLET | Freq: Every day | ORAL | Status: DC
  Administered 2017-07-10: 20 mg via ORAL
  Filled 2017-07-09 (×2): qty 1

## 2017-07-09 MED ORDER — FERROUS SULFATE 325 (65 FE) MG PO TABS
325.0000 mg | ORAL_TABLET | Freq: Three times a day (TID) | ORAL | Status: DC
  Administered 2017-07-09 – 2017-07-11 (×5): 325 mg via ORAL
  Filled 2017-07-09 (×5): qty 1

## 2017-07-09 MED ORDER — DEXAMETHASONE SODIUM PHOSPHATE 10 MG/ML IJ SOLN
10.0000 mg | Freq: Once | INTRAMUSCULAR | Status: AC
  Administered 2017-07-09: 10 mg via INTRAVENOUS

## 2017-07-09 MED ORDER — 0.9 % SODIUM CHLORIDE (POUR BTL) OPTIME
TOPICAL | Status: DC | PRN
  Administered 2017-07-09: 1000 mL

## 2017-07-09 MED ORDER — HYDROCODONE-ACETAMINOPHEN 7.5-325 MG PO TABS: 1.0000 | ORAL_TABLET | ORAL | Status: DC | PRN

## 2017-07-09 MED ORDER — BUPIVACAINE IN DEXTROSE 0.75-8.25 % IT SOLN
INTRATHECAL | Status: DC | PRN
  Administered 2017-07-09: 1.6 mL via INTRATHECAL

## 2017-07-09 MED ORDER — DOCUSATE SODIUM 100 MG PO CAPS: 100.0000 mg | ORAL_CAPSULE | Freq: Two times a day (BID) | ORAL | 0 refills | Status: AC

## 2017-07-09 MED ORDER — METOCLOPRAMIDE HCL 5 MG PO TABS: 5.0000 mg | ORAL_TABLET | Freq: Three times a day (TID) | ORAL | Status: DC | PRN

## 2017-07-09 MED ORDER — ONDANSETRON HCL 4 MG PO TABS: 4.0000 mg | ORAL_TABLET | Freq: Four times a day (QID) | ORAL | Status: DC | PRN

## 2017-07-09 MED ORDER — BISACODYL 10 MG RE SUPP: 10.0000 mg | Freq: Every day | RECTAL | Status: DC | PRN

## 2017-07-09 MED ORDER — KETOROLAC TROMETHAMINE 30 MG/ML IJ SOLN: 30.0000 mg | Freq: Once | INTRAMUSCULAR | Status: DC | PRN

## 2017-07-09 MED ORDER — DOCUSATE SODIUM 100 MG PO CAPS
100.0000 mg | ORAL_CAPSULE | Freq: Two times a day (BID) | ORAL | Status: DC
  Administered 2017-07-09 – 2017-07-11 (×4): 100 mg via ORAL
  Filled 2017-07-09 (×4): qty 1

## 2017-07-09 MED ORDER — ONDANSETRON HCL 4 MG/2ML IJ SOLN
INTRAMUSCULAR | Status: DC | PRN
  Administered 2017-07-09: 4 mg via INTRAVENOUS

## 2017-07-09 MED ORDER — CEFAZOLIN SODIUM-DEXTROSE 2-4 GM/100ML-% IV SOLN
INTRAVENOUS | Status: AC
  Filled 2017-07-09: qty 100

## 2017-07-09 MED ORDER — ACETAMINOPHEN 325 MG PO TABS
325.0000 mg | ORAL_TABLET | Freq: Four times a day (QID) | ORAL | Status: DC | PRN
  Administered 2017-07-10: 650 mg via ORAL
  Filled 2017-07-09: qty 2

## 2017-07-09 MED ORDER — MENTHOL 3 MG MT LOZG: 1.0000 | LOZENGE | OROMUCOSAL | Status: DC | PRN

## 2017-07-09 MED ORDER — ONDANSETRON HCL 4 MG/2ML IJ SOLN: 4.0000 mg | Freq: Four times a day (QID) | INTRAMUSCULAR | Status: DC | PRN

## 2017-07-09 MED ORDER — ROPIVACAINE HCL 5 MG/ML IJ SOLN
INTRAMUSCULAR | Status: DC | PRN
  Administered 2017-07-09 (×2): 5 mL via PERINEURAL

## 2017-07-09 MED ORDER — DEXAMETHASONE SODIUM PHOSPHATE 10 MG/ML IJ SOLN
INTRAMUSCULAR | Status: AC
  Filled 2017-07-09: qty 1

## 2017-07-09 MED ORDER — DEXAMETHASONE SODIUM PHOSPHATE 10 MG/ML IJ SOLN: 10.0000 mg | Freq: Once | INTRAMUSCULAR | Status: DC

## 2017-07-09 MED ORDER — CEFAZOLIN SODIUM-DEXTROSE 2-4 GM/100ML-% IV SOLN
2.0000 g | Freq: Four times a day (QID) | INTRAVENOUS | Status: AC
  Administered 2017-07-09 (×2): 2 g via INTRAVENOUS
  Filled 2017-07-09 (×2): qty 100

## 2017-07-09 MED ORDER — FENTANYL CITRATE (PF) 100 MCG/2ML IJ SOLN
50.0000 ug | INTRAMUSCULAR | Status: DC
  Administered 2017-07-09: 50 ug via INTRAVENOUS

## 2017-07-09 MED ORDER — AMLODIPINE BESYLATE 5 MG PO TABS
5.0000 mg | ORAL_TABLET | Freq: Every day | ORAL | Status: DC
  Administered 2017-07-10 – 2017-07-11 (×2): 5 mg via ORAL
  Filled 2017-07-09 (×2): qty 1

## 2017-07-09 MED ORDER — HYDROCODONE-ACETAMINOPHEN 5-325 MG PO TABS
1.0000 | ORAL_TABLET | ORAL | Status: DC | PRN
  Administered 2017-07-09 (×2): 2 via ORAL
  Administered 2017-07-10 (×3): 1 via ORAL
  Administered 2017-07-11: 2 via ORAL
  Filled 2017-07-09: qty 1
  Filled 2017-07-09 (×3): qty 2
  Filled 2017-07-09 (×2): qty 1
  Filled 2017-07-09: qty 2

## 2017-07-09 MED ORDER — ROPIVACAINE HCL 7.5 MG/ML IJ SOLN
INTRAMUSCULAR | Status: DC | PRN
  Administered 2017-07-09 (×4): 5 mL via PERINEURAL

## 2017-07-09 MED ORDER — PROPOFOL 500 MG/50ML IV EMUL
INTRAVENOUS | Status: DC | PRN
  Administered 2017-07-09: 120 ug/kg/min via INTRAVENOUS

## 2017-07-09 MED ORDER — BUPIVACAINE-EPINEPHRINE (PF) 0.25% -1:200000 IJ SOLN
INTRAMUSCULAR | Status: DC | PRN
  Administered 2017-07-09: 30 mL

## 2017-07-09 MED ORDER — MAGNESIUM CITRATE PO SOLN: 1.0000 | Freq: Once | ORAL | Status: DC | PRN

## 2017-07-09 MED ORDER — CELECOXIB 200 MG PO CAPS
200.0000 mg | ORAL_CAPSULE | Freq: Two times a day (BID) | ORAL | Status: DC
  Administered 2017-07-09 – 2017-07-11 (×4): 200 mg via ORAL
  Filled 2017-07-09 (×4): qty 1

## 2017-07-09 MED ORDER — PROPOFOL 10 MG/ML IV BOLUS
INTRAVENOUS | Status: AC
  Filled 2017-07-09: qty 60

## 2017-07-09 MED ORDER — CHLORHEXIDINE GLUCONATE 4 % EX LIQD: 60.0000 mL | Freq: Once | CUTANEOUS | Status: DC

## 2017-07-09 MED ORDER — POLYETHYLENE GLYCOL 3350 17 G PO PACK: 17.0000 g | PACK | Freq: Two times a day (BID) | ORAL | 0 refills | Status: AC

## 2017-07-09 MED ORDER — SODIUM CHLORIDE 0.9 % IR SOLN
Status: DC | PRN
  Administered 2017-07-09: 1000 mL

## 2017-07-09 MED ORDER — METHOCARBAMOL 500 MG PO TABS
500.0000 mg | ORAL_TABLET | Freq: Four times a day (QID) | ORAL | Status: DC | PRN
  Administered 2017-07-10: 500 mg via ORAL
  Filled 2017-07-09: qty 1

## 2017-07-09 MED ORDER — METOCLOPRAMIDE HCL 5 MG/ML IJ SOLN: 5.0000 mg | Freq: Three times a day (TID) | INTRAMUSCULAR | Status: DC | PRN

## 2017-07-09 MED ORDER — ONDANSETRON HCL 4 MG/2ML IJ SOLN
INTRAMUSCULAR | Status: AC
  Filled 2017-07-09: qty 2

## 2017-07-09 MED ORDER — KETOROLAC TROMETHAMINE 30 MG/ML IJ SOLN
INTRAMUSCULAR | Status: AC
  Filled 2017-07-09: qty 1

## 2017-07-09 MED ORDER — BUPIVACAINE-EPINEPHRINE (PF) 0.25% -1:200000 IJ SOLN
INTRAMUSCULAR | Status: AC
  Filled 2017-07-09: qty 30

## 2017-07-09 MED ORDER — SODIUM CHLORIDE 0.9 % IV SOLN
INTRAVENOUS | Status: DC
  Administered 2017-07-09: 15:00:00 via INTRAVENOUS

## 2017-07-09 MED ORDER — PROMETHAZINE HCL 25 MG/ML IJ SOLN: 6.2500 mg | INTRAMUSCULAR | Status: DC | PRN

## 2017-07-09 MED ORDER — KETOROLAC TROMETHAMINE 30 MG/ML IJ SOLN
INTRAMUSCULAR | Status: DC | PRN
  Administered 2017-07-09: 30 mg

## 2017-07-09 MED ORDER — TRANEXAMIC ACID 1000 MG/10ML IV SOLN
1000.0000 mg | Freq: Once | INTRAVENOUS | Status: AC
  Administered 2017-07-09: 1000 mg via INTRAVENOUS
  Filled 2017-07-09: qty 10
  Filled 2017-07-09: qty 1100

## 2017-07-09 MED ORDER — MIDAZOLAM HCL 2 MG/2ML IJ SOLN
INTRAMUSCULAR | Status: AC
  Filled 2017-07-09: qty 2

## 2017-07-09 MED ORDER — ALUM & MAG HYDROXIDE-SIMETH 200-200-20 MG/5ML PO SUSP: 15.0000 mL | ORAL | Status: DC | PRN

## 2017-07-09 MED ORDER — ASPIRIN 81 MG PO CHEW: 81.0000 mg | CHEWABLE_TABLET | Freq: Two times a day (BID) | ORAL | 0 refills | Status: AC

## 2017-07-09 MED ORDER — MORPHINE SULFATE (PF) 2 MG/ML IV SOLN: 0.5000 mg | INTRAVENOUS | Status: DC | PRN

## 2017-07-09 MED ORDER — DEXTROSE 5 % IV SOLN
500.0000 mg | Freq: Four times a day (QID) | INTRAVENOUS | Status: DC | PRN
  Filled 2017-07-09: qty 5

## 2017-07-09 MED ORDER — FENTANYL CITRATE (PF) 100 MCG/2ML IJ SOLN
INTRAMUSCULAR | Status: AC
  Filled 2017-07-09: qty 2

## 2017-07-09 MED ORDER — LACTATED RINGERS IV SOLN
INTRAVENOUS | Status: DC
  Administered 2017-07-09 (×2): via INTRAVENOUS

## 2017-07-09 MED ORDER — SODIUM CHLORIDE 0.9 % IJ SOLN
INTRAMUSCULAR | Status: DC | PRN
  Administered 2017-07-09: 29 mL

## 2017-07-09 MED ORDER — MEPERIDINE HCL 50 MG/ML IJ SOLN: 6.2500 mg | INTRAMUSCULAR | Status: DC | PRN

## 2017-07-09 MED ORDER — PHENOL 1.4 % MT LIQD: 1.0000 | OROMUCOSAL | Status: DC | PRN

## 2017-07-09 MED ORDER — PROPOFOL 10 MG/ML IV BOLUS
INTRAVENOUS | Status: DC | PRN
  Administered 2017-07-09: 30 mg via INTRAVENOUS

## 2017-07-09 MED ORDER — MIDAZOLAM HCL 2 MG/2ML IJ SOLN
1.0000 mg | INTRAMUSCULAR | Status: DC
  Administered 2017-07-09: 1 mg via INTRAVENOUS

## 2017-07-09 MED ORDER — STERILE WATER FOR IRRIGATION IR SOLN
Status: DC | PRN
  Administered 2017-07-09: 2000 mL

## 2017-07-09 MED ORDER — ASPIRIN 81 MG PO CHEW
81.0000 mg | CHEWABLE_TABLET | Freq: Two times a day (BID) | ORAL | Status: DC
  Administered 2017-07-09 – 2017-07-11 (×4): 81 mg via ORAL
  Filled 2017-07-09 (×4): qty 1

## 2017-07-09 MED ORDER — HYDROCODONE-ACETAMINOPHEN 7.5-325 MG PO TABS: 1.0000 | ORAL_TABLET | ORAL | 0 refills | Status: DC | PRN

## 2017-07-09 MED ORDER — DEXAMETHASONE SODIUM PHOSPHATE 10 MG/ML IJ SOLN
10.0000 mg | Freq: Once | INTRAMUSCULAR | Status: AC
  Administered 2017-07-10: 10 mg via INTRAVENOUS
  Filled 2017-07-09: qty 1

## 2017-07-09 MED ORDER — DIPHENHYDRAMINE HCL 12.5 MG/5ML PO ELIX: 12.5000 mg | ORAL_SOLUTION | ORAL | Status: DC | PRN

## 2017-07-09 MED ORDER — CEFAZOLIN SODIUM-DEXTROSE 2-4 GM/100ML-% IV SOLN
2.0000 g | INTRAVENOUS | Status: AC
  Administered 2017-07-09: 2 g via INTRAVENOUS

## 2017-07-09 MED ORDER — POLYETHYLENE GLYCOL 3350 17 G PO PACK
17.0000 g | PACK | Freq: Two times a day (BID) | ORAL | Status: DC
  Administered 2017-07-09 – 2017-07-11 (×4): 17 g via ORAL
  Filled 2017-07-09 (×4): qty 1

## 2017-07-09 MED ORDER — FERROUS SULFATE 325 (65 FE) MG PO TABS: 325.0000 mg | ORAL_TABLET | Freq: Three times a day (TID) | ORAL | 3 refills | Status: DC

## 2017-07-09 MED ORDER — HYDROMORPHONE HCL 1 MG/ML IJ SOLN: 0.2500 mg | INTRAMUSCULAR | Status: DC | PRN

## 2017-07-09 MED ORDER — METOPROLOL SUCCINATE ER 50 MG PO TB24
100.0000 mg | ORAL_TABLET | Freq: Every day | ORAL | Status: DC
  Administered 2017-07-10 – 2017-07-11 (×2): 100 mg via ORAL
  Filled 2017-07-09 (×2): qty 2

## 2017-07-09 SURGICAL SUPPLY — 55 items
ADH SKN CLS APL DERMABOND .7 (GAUZE/BANDAGES/DRESSINGS) ×1
BAG SPEC THK2 15X12 ZIP CLS (MISCELLANEOUS) ×1
BAG ZIPLOCK 12X15 (MISCELLANEOUS) ×2 IMPLANT
BANDAGE ACE 6X5 VEL STRL LF (GAUZE/BANDAGES/DRESSINGS) ×3 IMPLANT
BLADE SAW SGTL 13.0X1.19X90.0M (BLADE) ×3 IMPLANT
BOWL SMART MIX CTS (DISPOSABLE) ×3 IMPLANT
CAPT KNEE TOTAL 3 ATTUNE ×3 IMPLANT
CEMENT HV SMART SET (Cement) ×4 IMPLANT
COVER SURGICAL LIGHT HANDLE (MISCELLANEOUS) ×3 IMPLANT
CUFF TOURN SGL QUICK 34 (TOURNIQUET CUFF) ×3
CUFF TRNQT CYL 34X4X40X1 (TOURNIQUET CUFF) ×1 IMPLANT
DECANTER SPIKE VIAL GLASS SM (MISCELLANEOUS) ×6 IMPLANT
DERMABOND ADVANCED (GAUZE/BANDAGES/DRESSINGS) ×2
DERMABOND ADVANCED .7 DNX12 (GAUZE/BANDAGES/DRESSINGS) ×1 IMPLANT
DRAPE U-SHAPE 47X51 STRL (DRAPES) ×3 IMPLANT
DRESSING AQUACEL AG SP 3.5X10 (GAUZE/BANDAGES/DRESSINGS) ×1 IMPLANT
DRSG AQUACEL AG SP 3.5X10 (GAUZE/BANDAGES/DRESSINGS) ×3
DURAPREP 26ML APPLICATOR (WOUND CARE) ×6 IMPLANT
ELECT REM PT RETURN 15FT ADLT (MISCELLANEOUS) ×3 IMPLANT
GLOVE BIOGEL PI IND STRL 6.5 (GLOVE) IMPLANT
GLOVE BIOGEL PI IND STRL 7.0 (GLOVE) IMPLANT
GLOVE BIOGEL PI IND STRL 7.5 (GLOVE) ×1 IMPLANT
GLOVE BIOGEL PI IND STRL 8 (GLOVE) IMPLANT
GLOVE BIOGEL PI IND STRL 8.5 (GLOVE) ×1 IMPLANT
GLOVE BIOGEL PI INDICATOR 6.5 (GLOVE) ×4
GLOVE BIOGEL PI INDICATOR 7.0 (GLOVE) ×4
GLOVE BIOGEL PI INDICATOR 7.5 (GLOVE) ×8
GLOVE BIOGEL PI INDICATOR 8 (GLOVE) ×2
GLOVE BIOGEL PI INDICATOR 8.5 (GLOVE) ×2
GLOVE ECLIPSE 7.5 STRL STRAW (GLOVE) ×2 IMPLANT
GLOVE ECLIPSE 8.0 STRL XLNG CF (GLOVE) ×5 IMPLANT
GLOVE ORTHO TXT STRL SZ7.5 (GLOVE) ×6 IMPLANT
GLOVE SURG SS PI 6.0 STRL IVOR (GLOVE) ×4 IMPLANT
GOWN STRL REUS W/ TWL XL LVL3 (GOWN DISPOSABLE) ×1 IMPLANT
GOWN STRL REUS W/TWL 2XL LVL3 (GOWN DISPOSABLE) ×5 IMPLANT
GOWN STRL REUS W/TWL LRG LVL3 (GOWN DISPOSABLE) ×7 IMPLANT
GOWN STRL REUS W/TWL XL LVL3 (GOWN DISPOSABLE) ×3
HANDPIECE INTERPULSE COAX TIP (DISPOSABLE) ×3
HOLDER FOLEY CATH W/STRAP (MISCELLANEOUS) ×2 IMPLANT
MANIFOLD NEPTUNE II (INSTRUMENTS) ×3 IMPLANT
NDL SAFETY ECLIPSE 18X1.5 (NEEDLE) IMPLANT
NEEDLE HYPO 18GX1.5 SHARP (NEEDLE) ×3
PACK TOTAL KNEE CUSTOM (KITS) ×3 IMPLANT
POSITIONER SURGICAL ARM (MISCELLANEOUS) ×3 IMPLANT
SET HNDPC FAN SPRY TIP SCT (DISPOSABLE) ×1 IMPLANT
SET PAD KNEE POSITIONER (MISCELLANEOUS) ×3 IMPLANT
SUT MNCRL AB 4-0 PS2 18 (SUTURE) ×3 IMPLANT
SUT STRATAFIX PDS+ 0 24IN (SUTURE) ×3 IMPLANT
SUT VIC AB 1 CT1 36 (SUTURE) ×3 IMPLANT
SUT VIC AB 2-0 CT1 27 (SUTURE) ×9
SUT VIC AB 2-0 CT1 TAPERPNT 27 (SUTURE) ×3 IMPLANT
SYRINGE 3CC LL L/F (MISCELLANEOUS) ×3 IMPLANT
TRAY FOLEY MTR SLVR 16FR STAT (SET/KITS/TRAYS/PACK) ×3 IMPLANT
WRAP KNEE MAXI GEL POST OP (GAUZE/BANDAGES/DRESSINGS) ×3 IMPLANT
YANKAUER SUCT BULB TIP 10FT TU (MISCELLANEOUS) ×3 IMPLANT

## 2017-07-09 NOTE — Anesthesia Postprocedure Evaluation (Signed)
Anesthesia Post Note  Patient: Jesse Moran  Procedure(s) Performed: RIGHT TOTAL KNEE ARTHROPLASTY (Right Knee)     Patient location during evaluation: PACU Anesthesia Type: Spinal Level of consciousness: awake Pain management: pain level controlled Vital Signs Assessment: post-procedure vital signs reviewed and stable Respiratory status: spontaneous breathing Cardiovascular status: stable Postop Assessment: no headache, no backache, spinal receding, patient able to bend at knees and no apparent nausea or vomiting Anesthetic complications: no    Last Vitals:  Vitals:   07/09/17 1245 07/09/17 1300  BP: (!) 125/59 (!) 141/62  Pulse: (!) 47 (!) 46  Resp: 15 15  Temp: 36.4 C 36.4 C  SpO2: 97% 97%    Last Pain:  Vitals:   07/09/17 1245  TempSrc:   PainSc: 0-No pain   Pain Goal:                 Ashantia Amaral JR,JOHN Cree Kunert

## 2017-07-09 NOTE — Anesthesia Procedure Notes (Signed)
Date/Time: 07/09/2017 10:31 AM Performed by: Sharlette Dense, CRNA Oxygen Delivery Method: Simple face mask

## 2017-07-09 NOTE — Progress Notes (Signed)
Assisted Dr. Hatchett with right, ultrasound guided, adductor canal block. Side rails up, monitors on throughout procedure. See vital signs in flow sheet. Tolerated Procedure well.  

## 2017-07-09 NOTE — Anesthesia Procedure Notes (Signed)
Anesthesia Regional Block: Adductor canal block   Pre-Anesthetic Checklist: ,, timeout performed, Correct Patient, Correct Site, Correct Laterality, Correct Procedure, Correct Position, site marked, Risks and benefits discussed,  Surgical consent,  Pre-op evaluation,  At surgeon's request and post-op pain management  Laterality: Right and Lower  Prep: chloraprep       Needles:  Injection technique: Single-shot  Needle Type: Echogenic Stimulator Needle     Needle Length: 10cm  Needle Gauge: 21   Needle insertion depth: 2 cm   Additional Needles:   Procedures:,,,, ultrasound used (permanent image in chart),,,,  Narrative:  Start time: 07/09/2017 9:54 AM End time: 07/09/2017 10:04 AM Injection made incrementally with aspirations every 5 mL.

## 2017-07-09 NOTE — Transfer of Care (Signed)
Immediate Anesthesia Transfer of Care Note  Patient: Jesse Moran  Procedure(s) Performed: RIGHT TOTAL KNEE ARTHROPLASTY (Right Knee)  Patient Location: PACU  Anesthesia Type:Spinal and MAC combined with regional for post-op pain  Level of Consciousness: awake, alert , oriented and patient cooperative  Airway & Oxygen Therapy: Patient Spontanous Breathing and Patient connected to face mask oxygen  Post-op Assessment: Report given to RN and Post -op Vital signs reviewed and stable  Post vital signs: Reviewed and stable  Last Vitals:  Vitals Value Taken Time  BP 112/60 07/09/2017 12:15 PM  Temp    Pulse 46 07/09/2017 12:17 PM  Resp 14 07/09/2017 12:17 PM  SpO2 98 % 07/09/2017 12:17 PM  Vitals shown include unvalidated device data.  Last Pain:  Vitals:   07/09/17 1004  TempSrc:   PainSc: Asleep         Complications: No apparent anesthesia complications

## 2017-07-09 NOTE — Anesthesia Preprocedure Evaluation (Signed)
Anesthesia Evaluation  Patient identified by MRN, date of birth, ID band Patient awake    Reviewed: Allergy & Precautions, NPO status , Patient's Chart, lab work & pertinent test results, reviewed documented beta blocker date and time   Airway Mallampati: I       Dental no notable dental hx. (+) Teeth Intact   Pulmonary former smoker,    Pulmonary exam normal breath sounds clear to auscultation       Cardiovascular hypertension, Pt. on medications and Pt. on home beta blockers  Rhythm:Regular Rate:Normal     Neuro/Psych negative neurological ROS  negative psych ROS   GI/Hepatic negative GI ROS, Neg liver ROS,   Endo/Other  negative endocrine ROS  Renal/GU negative Renal ROS  negative genitourinary   Musculoskeletal   Abdominal Normal abdominal exam  (+)   Peds  Hematology negative hematology ROS (+)   Anesthesia Other Findings   Reproductive/Obstetrics                             Anesthesia Physical Anesthesia Plan  ASA: II  Anesthesia Plan: Spinal   Post-op Pain Management:  Regional for Post-op pain   Induction:   PONV Risk Score and Plan: 1 and Ondansetron  Airway Management Planned: Natural Airway and Simple Face Mask  Additional Equipment:   Intra-op Plan:   Post-operative Plan:   Informed Consent: I have reviewed the patients History and Physical, chart, labs and discussed the procedure including the risks, benefits and alternatives for the proposed anesthesia with the patient or authorized representative who has indicated his/her understanding and acceptance.     Plan Discussed with: CRNA and Surgeon  Anesthesia Plan Comments:         Anesthesia Quick Evaluation

## 2017-07-09 NOTE — Discharge Instructions (Signed)

## 2017-07-09 NOTE — Interval H&P Note (Signed)
History and Physical Interval Note:  07/09/2017 9:29 AM  Jesse Moran  has presented today for surgery, with the diagnosis of Right knee osteoarthritis  The various methods of treatment have been discussed with the patient and family. After consideration of risks, benefits and other options for treatment, the patient has consented to  Procedure(s) with comments: RIGHT TOTAL KNEE ARTHROPLASTY (Right) - 70 mins as a surgical intervention .  The patient's history has been reviewed, patient examined, no change in status, stable for surgery.  I have reviewed the patient's chart and labs.  Questions were answered to the patient's satisfaction.     [name]

## 2017-07-09 NOTE — Op Note (Signed)
NAME:  Jesse Moran                      MEDICAL RECORD NO.:  [id number]                             FACILITY:  [facility]      PHYSICIAN:  Pietro Cassis. Alvan Dame, M.D.  DATE OF BIRTH:  05-03-1940      DATE OF PROCEDURE:  07/09/2017                                     OPERATIVE REPORT         PREOPERATIVE DIAGNOSIS:  Right knee osteoarthritis.      POSTOPERATIVE DIAGNOSIS:  Right knee osteoarthritis.      FINDINGS:  The patient was noted to have complete loss of cartilage and   bone-on-bone arthritis with associated osteophytes in the medial and patellofemoral compartments of   the knee with a significant synovitis and associated effusion.  The patient had failed months of conservative treatment including medications, injection therapy, activity modification.     PROCEDURE:  Right total knee replacement.      COMPONENTS USED:  DePuy Attune rotating platform posterior stabilized knee   system, a size 7 femur, 6 tibia, size 6 mm PS AOX insert, and 38 anatomic patellar   button.      SURGEON:  Pietro Cassis. Alvan Dame, M.D.      ASSISTANT:  Danae Orleans, PA-C.      ANESTHESIA:  Regional and Spinal.      SPECIMENS:  None.      COMPLICATION:  None.      DRAINS:  None.  EBL: <100cc      TOURNIQUET TIME:   Total Tourniquet Time Documented: Thigh (Right) - 33 minutes Total: Thigh (Right) - 33 minutes  .      The patient was stable to the recovery room.      INDICATION FOR PROCEDURE:  [name] is a 77 y.o. male patient of   mine.  The patient had been seen, evaluated, and treated for months conservatively in the   office with medication, activity modification, and injections.  The patient had   radiographic changes of bone-on-bone arthritis with endplate sclerosis and osteophytes noted.  Based on the radiographic changes and failed conservative measures, the patient   decided to proceed with definitive treatment, total knee replacement.  Risks of infection, DVT, component failure, need  for revision surgery, neurovascular injury were reviewed in the office setting.  The postop course was reviewed stressing the efforts to maximize post-operative satisfaction and function.  Consent was obtained for benefit of pain   relief.      PROCEDURE IN DETAIL:  The patient was brought to the operative theater.   Once adequate anesthesia, preoperative antibiotics, 2 gm of Ancef,1 gm of Tranexamic Acid, and 10 mg of Decadron administered, the patient was positioned supine with a right thigh tourniquet placed.  The  right lower extremity was prepped and draped in sterile fashion.  A time-   out was performed identifying the patient, planned procedure, and the appropriate extremity.      The right lower extremity was placed in the [facility] leg holder.  The leg was   exsanguinated, tourniquet elevated to 250 mmHg.  A midline incision was   made  followed by median parapatellar arthrotomy.  Following initial   exposure, attention was first directed to the patella.  Precut   measurement was noted to be 26 mm.  I resected down to 15 mm and used a   38 anatomic patellar button to restore patellar height as well as cover the cut surface.      The lug holes were drilled and a metal shim was placed to protect the   patella from retractors and saw blade during the procedure.      At this point, attention was now directed to the femur.  The femoral   canal was opened with a drill, irrigated to try to prevent fat emboli.  An   intramedullary rod was passed at 3 degrees valgus, 9 mm of bone was   resected off the distal femur.  Following this resection, the tibia was   subluxated anteriorly.  Using the extramedullary guide, 2 mm of bone was resected off   the proximal medial tibia.  We confirmed the gap would be   stable medially and laterally with a size 5 spacer block as well as confirmed that the tibial cut was perpendicular in the coronal plane, checking with an alignment rod.      Once this was  done, I sized the femur to be a size 7 in the anterior-   posterior dimension, chose a standard component based on medial and   lateral dimension.  The size 7 rotation block was then pinned in   position anterior referenced using the C-clamp to set rotation.  The   anterior, posterior, and  chamfer cuts were made without difficulty nor   notching making certain that I was along the anterior cortex to help   with flexion gap stability.      The final box cut was made off the lateral aspect of distal femur.      At this point, the tibia was sized to be a size 6.  The size 6 tray was   then pinned in position through the medial third of the tubercle,   drilled, and keel punched.  Trial reduction was now carried with a 7 femur,  6 tibia, a size 6 mm PS insert, and the 38 anatomic patella botton.  The knee was brought to full extension with good flexion stability with the patella   tracking through the trochlea without application of pressure.  Given   all these findings the trial components removed.  Final components were   opened and cement was mixed.  The knee was irrigated with normal saline solution and pulse lavage.  The synovial lining was   then injected with 30 cc of 0.25% Marcaine with epinephrine, 1 cc of Toradol and 30 cc of NS for a total of 61 cc.     Final implants were then cemented onto cleaned and dried cut surfaces of bone with the knee brought to extension with a size 6 mm PS trial insert.      Once the cement had fully cured, excess cement was removed   throughout the knee.  I confirmed that I was satisfied with the range of   motion and stability, and the final size 6 mm PS AOX insert was chosen.  It was   placed into the knee.      The tourniquet had been let down at 33 minutes.  No significant   hemostasis was required.  The extensor mechanism was then reapproximated using #1 Vicryl and #  1 Stratafix sutures with the knee   in flexion.  The   remaining wound was closed  with 2-0 Vicryl and running 4-0 Monocryl.   The knee was cleaned, dried, dressed sterilely using Dermabond and   Aquacel dressing.  The patient was then   brought to recovery room in stable condition, tolerating the procedure   well.   Please note that Physician Assistant, Danae Orleans, PA-C was present for the entirety of the case, and was utilized for pre-operative positioning, peri-operative retractor management, general facilitation of the procedure and for primary wound closure at the end of the case.              Pietro Cassis Alvan Dame, M.D.    07/09/2017 11:51 AM

## 2017-07-09 NOTE — Evaluation (Signed)
Physical Therapy Evaluation Patient Details Name: Jesse Moran MRN: [id number] DOB: [birth year] Today's Date: 07/09/2017   History of Present Illness  R TKA  Clinical Impression  Pt admitted with above diagnosis. Pt currently with functional limitations due to the deficits listed below (see PT Problem List). Pt ambulated 30' with RW and performed TKA exercises with min assist. Good progress expected.  Pt will benefit from skilled PT to increase their independence and safety with mobility to allow discharge to the venue listed below.       Follow Up Recommendations Follow surgeon's recommendation for DC plan and follow-up therapies    Equipment Recommendations  3in1 (PT)    Recommendations for Other Services       Precautions / Restrictions Precautions Precautions: Knee;Fall Restrictions Weight Bearing Restrictions: No Other Position/Activity Restrictions: WBAT      Mobility  Bed Mobility Overal bed mobility: Modified Independent             General bed mobility comments: HOB up  Transfers Overall transfer level: Needs assistance Equipment used: Rolling walker (2 wheeled) Transfers: Sit to/from Stand Sit to Stand: Min guard         General transfer comment: VCs hand placement  Ambulation/Gait Ambulation/Gait assistance: Min guard Gait Distance (Feet): 30 Feet Assistive device: Rolling walker (2 wheeled) Gait Pattern/deviations: Step-to pattern;Decreased step length - right;Decreased step length - left Gait velocity: decr   General Gait Details: VCs sequencing  Stairs            Wheelchair Mobility    Modified Rankin (Stroke Patients Only)       Balance Overall balance assessment: Modified Independent                                           Pertinent Vitals/Pain Pain Assessment: 0-10 Pain Score: 4  Pain Descriptors / Indicators: Sore Pain Intervention(s): Limited activity within patient's tolerance;Monitored during  session;Premedicated before session;Ice applied    Home Living Family/patient expects to be discharged to:: (P) Private residence Living Arrangements: (P) Spouse/significant other Available Help at Discharge: (P) Family;Available 24 hours/day   Home Access: (P) Stairs to enter   Entrance Stairs-Number of Steps: (P) 1 Home Layout: (P) Two level;Able to live on main level with bedroom/bathroom Home Equipment: (P) Walker - 2 wheels;Cane - quad      Prior Function Level of Independence: (P) Independent with assistive device(s)         Comments: (P) walked with quad cane     Hand Dominance        Extremity/Trunk Assessment   Upper Extremity Assessment Upper Extremity Assessment: Overall WFL for tasks assessed    Lower Extremity Assessment Lower Extremity Assessment: RLE deficits/detail RLE Deficits / Details: 5-60* AAROM R knee, 3/5 SLR RLE Sensation: WNL    Cervical / Trunk Assessment Cervical / Trunk Assessment: Normal  Communication   Communication: (P) HOH  Cognition Arousal/Alertness: Awake/alert Behavior During Therapy: WFL for tasks assessed/performed Overall Cognitive Status: Within Functional Limits for tasks assessed                                        General Comments      Exercises Total Joint Exercises Ankle Circles/Pumps: AROM;Both;10 reps;Supine Quad Sets: AROM;Right;5 reps;Supine Heel Slides: AAROM;Right;10 reps;Supine Long Arc  Quad: AROM;Right;5 reps;Seated Goniometric ROM: 5-60* AAROM   Assessment/Plan    PT Assessment Patient needs continued PT services  PT Problem List Decreased strength;Decreased range of motion;Decreased activity tolerance;Decreased mobility;Pain       PT Treatment Interventions Gait training;Stair training;DME instruction;Patient/family education;Therapeutic activities;Therapeutic exercise    PT Goals (Current goals can be found in the Care Plan section)  Acute Rehab PT Goals Patient Stated  Goal: go to [facility] in July for 5 y.o. great grandaughter's bday party PT Goal Formulation: With patient/family Time For Goal Achievement: 07/16/17 Potential to Achieve Goals: Good    Frequency 7X/week   Barriers to discharge        Co-evaluation               AM-PAC PT "6 Clicks" Daily Activity  Outcome Measure Difficulty turning over in bed (including adjusting bedclothes, sheets and blankets)?: A Little Difficulty moving from lying on back to sitting on the side of the bed? : A Little Difficulty sitting down on and standing up from a chair with arms (e.g., wheelchair, bedside commode, etc,.)?: A Little Help needed moving to and from a bed to chair (including a wheelchair)?: A Little Help needed walking in hospital room?: A Little Help needed climbing 3-5 steps with a railing? : A Lot 6 Click Score: 17    End of Session Equipment Utilized During Treatment: Gait belt Activity Tolerance: Patient tolerated treatment well Patient left: in chair;with call bell/phone within reach;with family/visitor present Nurse Communication: Mobility status PT Visit Diagnosis: Muscle weakness (generalized) (M62.81);Difficulty in walking, not elsewhere classified (R26.2);Pain Pain - Right/Left: Right Pain - part of body: Knee    Time: 2334-3568 PT Time Calculation (min) (ACUTE ONLY): 22 min   Charges:   PT Evaluation $PT Eval Low Complexity: 1 Low     PT G Codes:          Philomena Doheny 07/09/2017, 5:01 PM [phone]

## 2017-07-09 NOTE — Progress Notes (Signed)
Care Plan Notes 04/10/2017 to 07/09/2017       Care Plan by Leonides Grills A at 07/05/2017  2:57 PM    Date of Service   Author Author Type Status Note Type File Time  07/05/2017  [place], [place] Plan 07/05/2017             Ortho Bundle R TKA scheduled on 07-09-17 DCP:  Home with spouse.  1 story/1 ste DME:  No needs. PT:  [facility].  PT eval scheduled on 07-15-17.

## 2017-07-09 NOTE — Anesthesia Procedure Notes (Signed)
Spinal  Patient location during procedure: OR Start time: 07/09/2017 10:34 AM End time: 07/09/2017 10:37 AM Staffing Anesthesiologist: Lyn Hollingshead, MD Performed: anesthesiologist  Preanesthetic Checklist Completed: patient identified, site marked, surgical consent, pre-op evaluation, timeout performed, IV checked, risks and benefits discussed and monitors and equipment checked Spinal Block Patient position: sitting Prep: ChloraPrep and site prepped and draped Patient monitoring: continuous pulse ox and blood pressure Approach: midline Location: L2-3 Injection technique: single-shot Needle Needle type: Pencan  Needle gauge: 24 G Needle length: 10 cm Needle insertion depth: 6 cm Assessment Sensory level: T8

## 2017-07-10 DIAGNOSIS — Z79899 Other long term (current) drug therapy: Secondary | ICD-10-CM | POA: Diagnosis not present

## 2017-07-10 DIAGNOSIS — Z91018 Allergy to other foods: Secondary | ICD-10-CM | POA: Diagnosis not present

## 2017-07-10 DIAGNOSIS — E785 Hyperlipidemia, unspecified: Secondary | ICD-10-CM | POA: Diagnosis present

## 2017-07-10 DIAGNOSIS — E663 Overweight: Secondary | ICD-10-CM | POA: Diagnosis present

## 2017-07-10 DIAGNOSIS — Z87891 Personal history of nicotine dependence: Secondary | ICD-10-CM | POA: Diagnosis not present

## 2017-07-10 DIAGNOSIS — Z8546 Personal history of malignant neoplasm of prostate: Secondary | ICD-10-CM | POA: Diagnosis not present

## 2017-07-10 DIAGNOSIS — Z6829 Body mass index (BMI) 29.0-29.9, adult: Secondary | ICD-10-CM | POA: Diagnosis not present

## 2017-07-10 DIAGNOSIS — M1711 Unilateral primary osteoarthritis, right knee: Secondary | ICD-10-CM | POA: Diagnosis present

## 2017-07-10 DIAGNOSIS — R12 Heartburn: Secondary | ICD-10-CM | POA: Diagnosis present

## 2017-07-10 DIAGNOSIS — Z9079 Acquired absence of other genital organ(s): Secondary | ICD-10-CM | POA: Diagnosis not present

## 2017-07-10 DIAGNOSIS — Z87442 Personal history of urinary calculi: Secondary | ICD-10-CM | POA: Diagnosis not present

## 2017-07-10 DIAGNOSIS — Z7982 Long term (current) use of aspirin: Secondary | ICD-10-CM | POA: Diagnosis not present

## 2017-07-10 DIAGNOSIS — I10 Essential (primary) hypertension: Secondary | ICD-10-CM | POA: Diagnosis present

## 2017-07-10 DIAGNOSIS — M659 Synovitis and tenosynovitis, unspecified: Secondary | ICD-10-CM | POA: Diagnosis present

## 2017-07-10 LAB — CBC
HEMATOCRIT: 37.2 % — AB (ref 39.0–52.0)
HEMOGLOBIN: 12.8 g/dL — AB (ref 13.0–17.0)
MCH: 33.5 pg (ref 26.0–34.0)
MCHC: 34.4 g/dL (ref 30.0–36.0)
MCV: 97.4 fL (ref 78.0–100.0)
Platelets: 192 10*3/uL (ref 150–400)
RBC: 3.82 MIL/uL — ABNORMAL LOW (ref 4.22–5.81)
RDW: 12.9 % (ref 11.5–15.5)
WBC: 15.4 10*3/uL — ABNORMAL HIGH (ref 4.0–10.5)

## 2017-07-10 LAB — BASIC METABOLIC PANEL WITH GFR
Anion gap: 6 (ref 5–15)
BUN: 24 mg/dL — ABNORMAL HIGH (ref 6–20)
CO2: 23 mmol/L (ref 22–32)
Calcium: 8.6 mg/dL — ABNORMAL LOW (ref 8.9–10.3)
Chloride: 111 mmol/L (ref 101–111)
Creatinine, Ser: 0.96 mg/dL (ref 0.61–1.24)
GFR calc Af Amer: >60 mL/min
GFR calc non Af Amer: >60 mL/min
Glucose, Bld: 202 mg/dL — ABNORMAL HIGH (ref 65–99)
Potassium: 4.2 mmol/L (ref 3.5–5.1)
Sodium: 140 mmol/L (ref 135–145)

## 2017-07-10 NOTE — Progress Notes (Signed)
     Subjective: 1 Day Post-Op Procedure(s) (LRB): RIGHT TOTAL KNEE ARTHROPLASTY (Right)   Patient reports pain as mild, pain controlled.  No events throughout the night.  Plan for discharge tomorrow due to pain control and need for inpatient therapy to meet goal of being discharged home safely with family/caregiver.   Anticipated LOS equal to or greater than 2 midnights due to - Age 75 and older with one or more of the following:  - Overweight  - Expected need for hospital services (PT, OT, Nursing) required for safe  discharge    Objective:   VITALS:   Vitals:   07/10/17 0133 07/10/17 0609  BP: 134/69 120/69  Pulse: 61 65  Resp: 16 16  Temp: 98.4 F (36.9 C) 97.8 F (36.6 C)  SpO2: 94% 93%    Dorsiflexion/Plantar flexion intact Incision: dressing C/D/I No cellulitis present Compartment soft  LABS Recent Labs    07/10/17 0549  HGB 12.8*  HCT 37.2*  WBC 15.4*  PLT 192    Recent Labs    07/10/17 0549  NA 140  K 4.2  BUN 24*  CREATININE 0.96  GLUCOSE 202*     Assessment/Plan: 1 Day Post-Op Procedure(s) (LRB): RIGHT TOTAL KNEE ARTHROPLASTY (Right) Foley cath d/c'ed Advance diet Up with therapy D/C IV fluids Discharge home when ready, possibly tomorrow   Overweight (BMI 25-29.9) Estimated body mass index is 29.71 kg/m as calculated from the following:   Height as of this encounter: 5\' 11"  (1.803 m).   Weight as of this encounter: 96.6 kg (213 lb). Patient also counseled that weight may inhibit the healing process Patient counseled that losing weight will help with future health issues      [place] P[place]. Jamyra Zweig   PAC  07/10/2017, 8:37 AM

## 2017-07-10 NOTE — Progress Notes (Signed)
Physical Therapy Treatment Patient Details Name: Jesse Moran MRN: [id number] DOB: [birth year] Today's Date: 07/10/2017    History of Present Illness R TKA    PT Comments    Pt is progressing quite well with mobility, he ambulated 180' with RW and performed TKA exercises with min assist.    Follow Up Recommendations  Follow surgeon's recommendation for DC plan and follow-up therapies;Outpatient PT(outpt PT scheduled for Monday)     Equipment Recommendations  3in1 (PT)    Recommendations for Other Services       Precautions / Restrictions Precautions Precautions: Knee;Fall Precaution Booklet Issued: Yes (comment) Precaution Comments: reviewed no pillow under knee Restrictions Weight Bearing Restrictions: No Other Position/Activity Restrictions: WBAT    Mobility  Bed Mobility Overal bed mobility: Modified Independent             General bed mobility comments: HOB up  Transfers Overall transfer level: Needs assistance Equipment used: Rolling walker (2 wheeled) Transfers: Sit to/from Stand Sit to Stand: Min guard         General transfer comment: VCs hand placement  Ambulation/Gait Ambulation/Gait assistance: Min guard Gait Distance (Feet): 180 Feet Assistive device: Rolling walker (2 wheeled) Gait Pattern/deviations: Step-to pattern;Decreased step length - right;Decreased step length - left Gait velocity: decr   General Gait Details: VCs sequencing   Stairs             Wheelchair Mobility    Modified Rankin (Stroke Patients Only)       Balance Overall balance assessment: Modified Independent                                          Cognition Arousal/Alertness: Awake/alert Behavior During Therapy: WFL for tasks assessed/performed Overall Cognitive Status: Within Functional Limits for tasks assessed                                        Exercises Total Joint Exercises Ankle Circles/Pumps:  AROM;Both;10 reps;Supine Quad Sets: AROM;Right;5 reps;Supine Short Arc Quad: AROM;Right;10 reps;Supine Heel Slides: AAROM;Right;10 reps;Supine Hip ABduction/ADduction: AROM;Right;10 reps;Supine Straight Leg Raises: AROM;AAROM;Right;10 reps;Supine Long Arc Quad: AROM;Right;5 reps;Seated Knee Flexion: AROM;AAROM;Right;5 reps;Seated Goniometric ROM: 5-90* AAROM R knee    General Comments        Pertinent Vitals/Pain Pain Score: 4  Pain Location: R knee Pain Descriptors / Indicators: Sore Pain Intervention(s): Limited activity within patient's tolerance;Monitored during session;Patient requesting pain meds-RN notified;RN gave pain meds during session;Ice applied    Home Living                      Prior Function            PT Goals (current goals can now be found in the care plan section) Acute Rehab PT Goals Patient Stated Goal: go to [facility] in July for 5 y.o. great grandaughter's bday party PT Goal Formulation: With patient/family Time For Goal Achievement: 07/16/17 Potential to Achieve Goals: Good Progress towards PT goals: Progressing toward goals    Frequency    7X/week      PT Plan Current plan remains appropriate    Co-evaluation              AM-PAC PT "6 Clicks" Daily Activity  Outcome Measure  Difficulty turning over in bed (  including adjusting bedclothes, sheets and blankets)?: None Difficulty moving from lying on back to sitting on the side of the bed? : A Little Difficulty sitting down on and standing up from a chair with arms (e.g., wheelchair, bedside commode, etc,.)?: A Little Help needed moving to and from a bed to chair (including a wheelchair)?: A Little Help needed walking in hospital room?: A Little Help needed climbing 3-5 steps with a railing? : A Lot 6 Click Score: 18    End of Session Equipment Utilized During Treatment: Gait belt Activity Tolerance: Patient tolerated treatment well Patient left: in chair;with call  bell/phone within reach;with family/visitor present Nurse Communication: Mobility status PT Visit Diagnosis: Muscle weakness (generalized) (M62.81);Difficulty in walking, not elsewhere classified (R26.2);Pain Pain - Right/Left: Right Pain - part of body: Knee     Time: 5379-4327 PT Time Calculation (min) (ACUTE ONLY): 35 min  Charges:  $Gait Training: 8-22 mins $Therapeutic Exercise: 8-22 mins                    G Codes:         Philomena Doheny 07/10/2017, 11:06 AM [phone]

## 2017-07-10 NOTE — Progress Notes (Signed)
Physical Therapy Treatment Patient Details Name: Jesse Moran MRN: [id number] DOB: 1940-03-05 Today's Date: 07/10/2017    History of Present Illness R TKA    PT Comments    Pt has progressed exceptionally well with mobility. He ambulated 250' with RW independently, completed stair training, and demonstrates understanding of HEP. From PT standpoint, he is ready to DC home.   Follow Up Recommendations  Follow surgeon's recommendation for DC plan and follow-up therapies;Outpatient PT(outpt PT scheduled for Monday)     Equipment Recommendations  3in1 (PT)    Recommendations for Other Services       Precautions / Restrictions Precautions Precautions: Knee;Fall Precaution Booklet Issued: Yes (comment) Precaution Comments: reviewed no pillow under knee Restrictions Weight Bearing Restrictions: No Other Position/Activity Restrictions: WBAT    Mobility  Bed Mobility Overal bed mobility: Modified Independent             General bed mobility comments: up in recliner  Transfers Overall transfer level: Needs assistance Equipment used: Rolling walker (2 wheeled) Transfers: Sit to/from Stand Sit to Stand: Modified independent (Device/Increase time)         General transfer comment: VCs hand placement  Ambulation/Gait Ambulation/Gait assistance: Modified independent (Device/Increase time) Gait Distance (Feet): 250 Feet Assistive device: Rolling walker (2 wheeled) Gait Pattern/deviations: Step-to pattern;Decreased step length - right;Decreased step length - left Gait velocity: decr   General Gait Details: steady, no loss of balance   Stairs Stairs: Yes Stairs assistance: Min guard Stair Management: No rails;Backwards;Step to pattern;With walker Number of Stairs: 1 General stair comments: VCs sequencing   Wheelchair Mobility    Modified Rankin (Stroke Patients Only)       Balance Overall balance assessment: Modified Independent                                           Cognition Arousal/Alertness: Awake/alert Behavior During Therapy: WFL for tasks assessed/performed Overall Cognitive Status: Within Functional Limits for tasks assessed                                        Exercises Total Joint Exercises Ankle Circles/Pumps: AROM;Both;10 reps;Supine Quad Sets: AROM;Right;5 reps;Supine Short Arc Quad: AROM;Right;10 reps;Supine Heel Slides: AAROM;Right;10 reps;Supine Hip ABduction/ADduction: AROM;Right;10 reps;Supine Straight Leg Raises: AROM;AAROM;Right;10 reps;Supine Long Arc Quad: AROM;Right;Seated;10 reps Knee Flexion: AROM;AAROM;Right;5 reps;Seated Goniometric ROM: 5-100* AAROM R knee    General Comments        Pertinent Vitals/Pain Pain Score: 4  Pain Location: R knee Pain Descriptors / Indicators: Sore Pain Intervention(s): Limited activity within patient's tolerance;Monitored during session;Premedicated before session;Ice applied    Home Living                      Prior Function            PT Goals (current goals can now be found in the care plan section) Acute Rehab PT Goals Patient Stated Goal: go to [facility] in July for 5 y.o. great grandaughter's bday party PT Goal Formulation: With patient/family Time For Goal Achievement: 07/16/17 Potential to Achieve Goals: Good Progress towards PT goals: Progressing toward goals    Frequency    7X/week      PT Plan Current plan remains appropriate    Co-evaluation  AM-PAC PT "6 Clicks" Daily Activity  Outcome Measure  Difficulty turning over in bed (including adjusting bedclothes, sheets and blankets)?: None Difficulty moving from lying on back to sitting on the side of the bed? : None Difficulty sitting down on and standing up from a chair with arms (e.g., wheelchair, bedside commode, etc,.)?: None Help needed moving to and from a bed to chair (including a wheelchair)?: None Help needed  walking in hospital room?: None Help needed climbing 3-5 steps with a railing? : A Little 6 Click Score: 23    End of Session Equipment Utilized During Treatment: Gait belt Activity Tolerance: Patient tolerated treatment well Patient left: in chair;with call bell/phone within reach;with family/visitor present Nurse Communication: Mobility status PT Visit Diagnosis: Muscle weakness (generalized) (M62.81);Difficulty in walking, not elsewhere classified (R26.2);Pain Pain - Right/Left: Right Pain - part of body: Knee     Time: 5409-8119 PT Time Calculation (min) (ACUTE ONLY): 41 min  Charges:  $Gait Training: 8-22 mins $Therapeutic Exercise: 8-22 mins $Therapeutic Activity: 8-22 mins                    G Codes:          Philomena Doheny 07/10/2017, 1:44 PM [phone]

## 2017-07-11 LAB — CBC
HEMATOCRIT: 36.2 % — AB (ref 39.0–52.0)
Hemoglobin: 12.2 g/dL — ABNORMAL LOW (ref 13.0–17.0)
MCH: 32.9 pg (ref 26.0–34.0)
MCHC: 33.7 g/dL (ref 30.0–36.0)
MCV: 97.6 fL (ref 78.0–100.0)
Platelets: 193 10*3/uL (ref 150–400)
RBC: 3.71 MIL/uL — AB (ref 4.22–5.81)
RDW: 13.3 % (ref 11.5–15.5)
WBC: 16.6 10*3/uL — AB (ref 4.0–10.5)

## 2017-07-11 LAB — BASIC METABOLIC PANEL
ANION GAP: 5 (ref 5–15)
BUN: 24 mg/dL — ABNORMAL HIGH (ref 6–20)
CHLORIDE: 110 mmol/L (ref 101–111)
CO2: 26 mmol/L (ref 22–32)
Calcium: 8.6 mg/dL — ABNORMAL LOW (ref 8.9–10.3)
Creatinine, Ser: 0.8 mg/dL (ref 0.61–1.24)
GFR calc Af Amer: >60 mL/min (ref >60)
GFR calc non Af Amer: >60 mL/min (ref >60)
GLUCOSE: 119 mg/dL — AB (ref 65–99)
POTASSIUM: 4.3 mmol/L (ref 3.5–5.1)
Sodium: 141 mmol/L (ref 135–145)

## 2017-07-11 NOTE — Progress Notes (Signed)
Patient ambulating to bathroom on walker  with no difficulty. Heart rate brady in 40's and 50's as baseline with otherwise vitals stable.

## 2017-07-11 NOTE — Progress Notes (Signed)
Physical Therapy Treatment Patient Details Name: Jesse Moran MRN: [id number] DOB: [birth year] Today's Date: 07/11/2017    History of Present Illness R TKA    PT Comments    POD #2 Pt progressing well.  Amb a great distance, practiced one step and performed all supine TKR TE's following handout HEP.  Instructed on proper tech, freq and use of ICE.   Follow Up Recommendations  Follow surgeon's recommendation for DC plan and follow-up therapies;Outpatient PT     Equipment Recommendations  3in1 (PT)    Recommendations for Other Services       Precautions / Restrictions Precautions Precautions: Knee;Fall Precaution Comments: reviewed no pillow under knee Restrictions Weight Bearing Restrictions: No Other Position/Activity Restrictions: WBAT    Mobility  Bed Mobility Overal bed mobility: Modified Independent             General bed mobility comments: able to self perform   Transfers Overall transfer level: Needs assistance Equipment used: Rolling walker (2 wheeled) Transfers: Sit to/from [facility] Sit to Stand: Modified independent (Device/Increase time) Stand pivot transfers: Modified independent (Device/Increase time)       General transfer comment: good safety cognition and use of hands to steady self  Ambulation/Gait Ambulation/Gait assistance: Modified independent (Device/Increase time) Gait Distance (Feet): 225 Feet Assistive device: Rolling walker (2 wheeled) Gait Pattern/deviations: Step-to pattern;Decreased step length - right;Decreased step length - left Gait velocity: WFL   General Gait Details: good alternating gait and upright posture   Stairs Stairs: Yes Stairs assistance: Min guard Stair Management: No rails;Step to pattern;Forwards Number of Stairs: 1 General stair comments: one initial VC on pr0per tech   Wheelchair Mobility    Modified Rankin (Stroke Patients Only)       Balance                                             Cognition Arousal/Alertness: Awake/alert Behavior During Therapy: WFL for tasks assessed/performed Overall Cognitive Status: Within Functional Limits for tasks assessed                                        Exercises   Total Knee Replacement TE's 10 reps B LE ankle pumps 10 reps towel squeezes 10 reps knee presses 10 reps heel slides  10 reps SAQ's 10 reps SLR's 10 reps ABD Followed by ICE     General Comments        Pertinent Vitals/Pain Pain Assessment: 0-10 Pain Score: 3  Pain Location: R knee Pain Descriptors / Indicators: Sore Pain Intervention(s): Monitored during session;Repositioned;Relaxation;Ice applied    Home Living                      Prior Function            PT Goals (current goals can now be found in the care plan section) Progress towards PT goals: Progressing toward goals    Frequency    7X/week      PT Plan Current plan remains appropriate    Co-evaluation              AM-PAC PT "6 Clicks" Daily Activity  Outcome Measure  Difficulty turning over in bed (including adjusting bedclothes, sheets and blankets)?: None Difficulty moving from lying  on back to sitting on the side of the bed? : None Difficulty sitting down on and standing up from a chair with arms (e.g., wheelchair, bedside commode, etc,.)?: None Help needed moving to and from a bed to chair (including a wheelchair)?: None Help needed walking in hospital room?: None Help needed climbing 3-5 steps with a railing? : A Little 6 Click Score: 23    End of Session Equipment Utilized During Treatment: Gait belt Activity Tolerance: Patient tolerated treatment well Patient left: in bed;with call bell/phone within reach Nurse Communication: (pt ready for D/C to home ) PT Visit Diagnosis: Muscle weakness (generalized) (M62.81);Difficulty in walking, not elsewhere classified (R26.2);Pain Pain - Right/Left: Right Pain  - part of body: Knee     Time: 2993-7169 PT Time Calculation (min) (ACUTE ONLY): 42 min  Charges:  $Gait Training: 8-22 mins $Therapeutic Exercise: 8-22 mins $Therapeutic Activity: 8-22 mins                    G Codes:       Rica Koyanagi  PTA [facility]  Acute  Rehab Pager      [phone]

## 2017-07-11 NOTE — Progress Notes (Signed)
Patient ID: KYL GIVLER, male   DOB: October 14, 1940, 77 y.o.   MRN: [id number] Subjective: 2 Days Post-Op Procedure(s) (LRB): RIGHT TOTAL KNEE ARTHROPLASTY (Right)    Patient reports pain as mild to moderate. Safe evening medically last night.  Ready for safe home discharge meeting PT goals  Objective:   VITALS:   Vitals:   07/11/17 0318 07/11/17 0507  BP: 132/67 139/70  Pulse: (!) 46 (!) 44  Resp: 14 16  Temp: 98.6 F (37 C) 98.4 F (36.9 C)  SpO2: 95% 98%    Neurovascular intact Incision: dressing C/D/I  LABS Recent Labs    07/10/17 0549 07/11/17 0513  HGB 12.8* 12.2*  HCT 37.2* 36.2*  WBC 15.4* 16.6*  PLT 192 193    Recent Labs    07/10/17 0549 07/11/17 0513  NA 140 141  K 4.2 4.3  BUN 24* 24*  CREATININE 0.96 0.80  GLUCOSE 202* 119*    No results for input(s): LABPT, INR in the last 72 hours.   Assessment/Plan: 2 Days Post-Op Procedure(s) (LRB): RIGHT TOTAL KNEE ARTHROPLASTY (Right)  Labs stable,  Vitals stable for discharge Advance diet Up with therapy  Home today  Reviewed goals Reviewed dressing management Knows to call with questions

## 2017-07-15 DIAGNOSIS — M25561 Pain in right knee: Secondary | ICD-10-CM | POA: Diagnosis not present

## 2017-07-16 NOTE — Discharge Summary (Signed)
Physician Discharge Summary  Patient ID: BRADSHAW MINIHAN MRN: [id number] DOB/AGE: July 14, 1940 77 y.o.  Admit date: 07/09/2017 Discharge date: 07/11/2017   Procedures:  Procedure(s) (LRB): RIGHT TOTAL KNEE ARTHROPLASTY (Right)  Attending Physician:  Dr. Paralee Cancel   Admission Diagnoses:   Right knee primary OA / pain  Discharge Diagnoses:  Principal Problem:   S/P right TKA Active Problems:   S/P total knee replacement  Past Medical History:  Diagnosis Date  . Arthritis    OA  . Cancer ([place]) 2008   PROSTATE  SURGERY DONE ROBOTICALLY  . History of kidney stones YRS AGO   X 1 PASSED STONE  . Hyperlipidemia   . Hypertension     HPI:    Jesse Moran, 77 y.o. male, has a history of pain and functional disability in the right knee due to arthritis and has failed non-surgical conservative treatments for greater than 12 weeks to includeNSAID's and/or analgesics, corticosteriod injections, viscosupplementation injections, use of assistive devices and activity modification.  Onset of symptoms was gradual, starting 4+ years ago with gradually worsening course since that time. The patient noted no past surgery on the right knee(s).  Patient currently rates pain in the right knee(s) at 8 out of 10 with activity. Patient has night pain, worsening of pain with activity and weight bearing, pain that interferes with activities of daily living, pain with passive range of motion, crepitus and joint swelling.  Patient has evidence of periarticular osteophytes and joint space narrowing by imaging studies. There is no active infection.  Risks, benefits and expectations were discussed with the patient.  Risks including but not limited to the risk of anesthesia, blood clots, nerve damage, blood vessel damage, failure of the prosthesis, infection and up to and including death.  Patient understand the risks, benefits and expectations and wishes to proceed with surgery.   PCP: Deland Pretty, MD    Discharged Condition: good  Hospital Course:  Patient underwent the above stated procedure on 07/09/2017. Patient tolerated the procedure well and brought to the recovery room in good condition and subsequently to the floor.  POD #1 BP: 120/69 ; Pulse: 65 ; Temp: 97.8 F (36.6 C) ; Resp: 16 Patient reports pain as mild, pain controlled.  No events throughout the night.  Plan for discharge tomorrowdue to pain control and need for inpatient therapy to meet goal of being discharged home safely with family/caregiver. Dorsiflexion/plantar flexion intact, incision: dressing C/D/I, no cellulitis present and compartment soft.   LABS  Basename    HGB     12.8  HCT     37.2   POD #2  BP: 139/70 ; Pulse: 44 ; Temp: 98.4 F (36.9 C) ; Resp: 16 Patient reports pain as mild to moderate. Safe evening medically last night.  Ready for safe home discharge meeting PT goals. Neurovascular intact and incision: dressing C/D/I.   LABS  Basename    HGB     12.2  HCT     36.2    Discharge Exam: General appearance: alert, cooperative and no distress Extremities: Homans sign is negative, no sign of DVT, no edema, redness or tenderness in the calves or thighs and no ulcers, gangrene or trophic changes  Disposition:  Home with follow up in 2 weeks   Follow-up Information    Paralee Cancel, MD. Schedule an appointment as soon as possible for a visit in 2 weeks.   Specialty:  Orthopedic Surgery Contact information: [place]  [place] [address] [phone]           Discharge Instructions    Call MD / Call 911   Complete by:  As directed    If you experience chest pain or shortness of breath, CALL 911 and be transported to the hospital emergency room.  If you develope a fever above 101 F, pus (white drainage) or increased drainage or redness at the wound, or calf pain, call your surgeon's office.   Change dressing   Complete by:  As directed    Maintain surgical dressing  until follow up in the clinic. If the edges start to pull up, may reinforce with tape. If the dressing is no longer working, may remove and cover with gauze and tape, but must keep the area dry and clean.  Call with any questions or concerns.   Constipation Prevention   Complete by:  As directed    Drink plenty of fluids.  Prune juice may be helpful.  You may use a stool softener, such as Colace (over the counter) 100 mg twice a day.  Use MiraLax (over the counter) for constipation as needed.   Diet - low sodium heart healthy   Complete by:  As directed    Discharge instructions   Complete by:  As directed    Maintain surgical dressing until follow up in the clinic. If the edges start to pull up, may reinforce with tape. If the dressing is no longer working, may remove and cover with gauze and tape, but must keep the area dry and clean.  Follow up in 2 weeks at [facility]. Call with any questions or concerns.   Increase activity slowly as tolerated   Complete by:  As directed    Weight bearing as tolerated with assist device (walker, cane, etc) as directed, use it as long as suggested by your surgeon or therapist, typically at least 4-6 weeks.   TED hose   Complete by:  As directed    Use stockings (TED hose) for 2 weeks on both leg(s).  You may remove them at night for sleeping.      Allergies as of 07/11/2017      Reactions   Gluten Meal    Abdominal pain      Medication List    STOP taking these medications   acetaminophen 500 MG tablet Commonly known as:  TYLENOL   aspirin EC 81 MG tablet Replaced by:  aspirin 81 MG chewable tablet   traMADol 50 MG tablet Commonly known as:  ULTRAM     TAKE these medications   amLODipine 5 MG tablet Commonly known as:  NORVASC Take 1 tablet by mouth Daily. 5 mg daily   aspirin 81 MG chewable tablet Commonly known as:  ASPIRIN CHILDRENS Chew 1 tablet (81 mg total) by mouth 2 (two) times daily. Take for 4 weeks, then resume  regular dose. Replaces:  aspirin EC 81 MG tablet   atorvastatin 40 MG tablet Commonly known as:  LIPITOR Take 20 mg by mouth Daily.   docusate sodium 100 MG capsule Commonly known as:  COLACE Take 1 capsule (100 mg total) by mouth 2 (two) times daily.   ferrous sulfate 325 (65 FE) MG tablet Commonly known as:  FERROUSUL Take 1 tablet (325 mg total) by mouth 3 (three) times daily with meals.   HYDROcodone-acetaminophen 7.5-325 MG tablet Commonly known as:  NORCO Take 1-2 tablets by mouth every 4 (four) hours as needed for moderate pain.  losartan 100 MG tablet Commonly known as:  COZAAR Take 100 mg by mouth daily.   methocarbamol 500 MG tablet Commonly known as:  ROBAXIN Take 1 tablet (500 mg total) by mouth every 6 (six) hours as needed for muscle spasms.   metoprolol succinate 100 MG 24 hr tablet Commonly known as:  TOPROL-XL Take 100 mg by mouth Daily.   multivitamin with minerals tablet Take 1 tablet by mouth daily.   OVER THE COUNTER MEDICATION Take 2 tablets by mouth 3 (three) times daily. Bone up otc supplement   polyethylene glycol packet Commonly known as:  MIRALAX / GLYCOLAX Take 17 g by mouth 2 (two) times daily.   vitamin B-1 250 MG tablet Take 250 mg by mouth daily.   vitamin B-12 500 MCG tablet Commonly known as:  CYANOCOBALAMIN Take 500 mcg by mouth daily.   Vitamin D 2000 units tablet Take 2,000 Units by mouth daily.            Discharge Care Instructions  (From admission, onward)        Start     Ordered   07/10/17 0000  Change dressing    Comments:  Maintain surgical dressing until follow up in the clinic. If the edges start to pull up, may reinforce with tape. If the dressing is no longer working, may remove and cover with gauze and tape, but must keep the area dry and clean.  Call with any questions or concerns.   07/10/17 0841       Signed: [place] Pugh. Tereasa Yilmaz   PA-C  07/16/2017, 9:20 AM

## 2017-07-17 DIAGNOSIS — M25561 Pain in right knee: Secondary | ICD-10-CM | POA: Diagnosis not present

## 2017-07-19 DIAGNOSIS — M25561 Pain in right knee: Secondary | ICD-10-CM | POA: Diagnosis not present

## 2017-07-22 DIAGNOSIS — M25561 Pain in right knee: Secondary | ICD-10-CM | POA: Diagnosis not present

## 2017-07-26 DIAGNOSIS — M25561 Pain in right knee: Secondary | ICD-10-CM | POA: Diagnosis not present

## 2017-07-29 DIAGNOSIS — M25561 Pain in right knee: Secondary | ICD-10-CM | POA: Diagnosis not present

## 2017-07-31 DIAGNOSIS — M25561 Pain in right knee: Secondary | ICD-10-CM | POA: Diagnosis not present

## 2017-08-02 DIAGNOSIS — M25561 Pain in right knee: Secondary | ICD-10-CM | POA: Diagnosis not present

## 2017-08-05 DIAGNOSIS — M25561 Pain in right knee: Secondary | ICD-10-CM | POA: Diagnosis not present

## 2017-08-07 DIAGNOSIS — M25561 Pain in right knee: Secondary | ICD-10-CM | POA: Diagnosis not present

## 2017-08-09 DIAGNOSIS — M25561 Pain in right knee: Secondary | ICD-10-CM | POA: Diagnosis not present

## 2017-08-12 DIAGNOSIS — M25561 Pain in right knee: Secondary | ICD-10-CM | POA: Diagnosis not present

## 2017-08-14 DIAGNOSIS — M25561 Pain in right knee: Secondary | ICD-10-CM | POA: Diagnosis not present

## 2017-08-16 DIAGNOSIS — M25561 Pain in right knee: Secondary | ICD-10-CM | POA: Diagnosis not present

## 2017-08-23 DIAGNOSIS — M25561 Pain in right knee: Secondary | ICD-10-CM | POA: Diagnosis not present

## 2017-08-26 DIAGNOSIS — M25561 Pain in right knee: Secondary | ICD-10-CM | POA: Diagnosis not present

## 2017-08-28 DIAGNOSIS — M25561 Pain in right knee: Secondary | ICD-10-CM | POA: Diagnosis not present

## 2017-08-28 DIAGNOSIS — M1711 Unilateral primary osteoarthritis, right knee: Secondary | ICD-10-CM | POA: Diagnosis not present

## 2017-08-30 DIAGNOSIS — M25561 Pain in right knee: Secondary | ICD-10-CM | POA: Diagnosis not present

## 2017-09-02 DIAGNOSIS — M25561 Pain in right knee: Secondary | ICD-10-CM | POA: Diagnosis not present

## 2017-09-06 DIAGNOSIS — M25561 Pain in right knee: Secondary | ICD-10-CM | POA: Diagnosis not present

## 2017-09-09 DIAGNOSIS — M25561 Pain in right knee: Secondary | ICD-10-CM | POA: Diagnosis not present

## 2017-09-13 DIAGNOSIS — M25561 Pain in right knee: Secondary | ICD-10-CM | POA: Diagnosis not present

## 2017-09-20 DIAGNOSIS — M25561 Pain in right knee: Secondary | ICD-10-CM | POA: Diagnosis not present

## 2017-09-24 DIAGNOSIS — M25561 Pain in right knee: Secondary | ICD-10-CM | POA: Diagnosis not present

## 2017-09-26 DIAGNOSIS — M25561 Pain in right knee: Secondary | ICD-10-CM | POA: Diagnosis not present

## 2017-10-03 DIAGNOSIS — M25561 Pain in right knee: Secondary | ICD-10-CM | POA: Diagnosis not present

## 2017-10-03 DIAGNOSIS — Z23 Encounter for immunization: Secondary | ICD-10-CM | POA: Diagnosis not present

## 2017-10-07 DIAGNOSIS — M25561 Pain in right knee: Secondary | ICD-10-CM | POA: Diagnosis not present

## 2018-06-25 DIAGNOSIS — I10 Essential (primary) hypertension: Secondary | ICD-10-CM | POA: Diagnosis not present

## 2018-06-25 DIAGNOSIS — Z125 Encounter for screening for malignant neoplasm of prostate: Secondary | ICD-10-CM | POA: Diagnosis not present

## 2018-07-02 DIAGNOSIS — E7800 Pure hypercholesterolemia, unspecified: Secondary | ICD-10-CM | POA: Diagnosis not present

## 2018-07-02 DIAGNOSIS — I10 Essential (primary) hypertension: Secondary | ICD-10-CM | POA: Diagnosis not present

## 2018-07-02 DIAGNOSIS — R7303 Prediabetes: Secondary | ICD-10-CM | POA: Diagnosis not present

## 2018-07-02 DIAGNOSIS — M175 Other unilateral secondary osteoarthritis of knee: Secondary | ICD-10-CM | POA: Diagnosis not present

## 2018-07-02 DIAGNOSIS — I4891 Unspecified atrial fibrillation: Secondary | ICD-10-CM | POA: Diagnosis not present

## 2018-07-10 DIAGNOSIS — Z96651 Presence of right artificial knee joint: Secondary | ICD-10-CM | POA: Diagnosis not present

## 2018-07-10 DIAGNOSIS — Z471 Aftercare following joint replacement surgery: Secondary | ICD-10-CM | POA: Diagnosis not present

## 2018-07-14 DIAGNOSIS — I480 Paroxysmal atrial fibrillation: Secondary | ICD-10-CM | POA: Diagnosis not present

## 2018-07-14 DIAGNOSIS — I10 Essential (primary) hypertension: Secondary | ICD-10-CM | POA: Diagnosis not present

## 2018-08-06 DIAGNOSIS — I480 Paroxysmal atrial fibrillation: Secondary | ICD-10-CM | POA: Diagnosis not present

## 2018-08-08 DIAGNOSIS — I472 Ventricular tachycardia: Secondary | ICD-10-CM | POA: Diagnosis not present

## 2018-08-08 DIAGNOSIS — I480 Paroxysmal atrial fibrillation: Secondary | ICD-10-CM | POA: Diagnosis not present

## 2018-08-25 ENCOUNTER — Other Ambulatory Visit: Payer: Self-pay

## 2018-09-26 DIAGNOSIS — Z23 Encounter for immunization: Secondary | ICD-10-CM | POA: Diagnosis not present

## 2018-10-20 DIAGNOSIS — I471 Supraventricular tachycardia: Secondary | ICD-10-CM | POA: Diagnosis not present

## 2018-10-20 DIAGNOSIS — I10 Essential (primary) hypertension: Secondary | ICD-10-CM | POA: Diagnosis not present

## 2018-10-20 DIAGNOSIS — I480 Paroxysmal atrial fibrillation: Secondary | ICD-10-CM | POA: Diagnosis not present

## 2019-03-31 DIAGNOSIS — D485 Neoplasm of uncertain behavior of skin: Secondary | ICD-10-CM | POA: Diagnosis not present

## 2019-03-31 DIAGNOSIS — L989 Disorder of the skin and subcutaneous tissue, unspecified: Secondary | ICD-10-CM | POA: Diagnosis not present

## 2019-03-31 DIAGNOSIS — D2372 Other benign neoplasm of skin of left lower limb, including hip: Secondary | ICD-10-CM | POA: Diagnosis not present

## 2019-04-13 DIAGNOSIS — I471 Supraventricular tachycardia: Secondary | ICD-10-CM | POA: Diagnosis not present

## 2019-04-29 DIAGNOSIS — C44519 Basal cell carcinoma of skin of other part of trunk: Secondary | ICD-10-CM | POA: Diagnosis not present

## 2019-04-29 DIAGNOSIS — L905 Scar conditions and fibrosis of skin: Secondary | ICD-10-CM | POA: Diagnosis not present

## 2019-07-01 DIAGNOSIS — I10 Essential (primary) hypertension: Secondary | ICD-10-CM | POA: Diagnosis not present

## 2019-07-01 DIAGNOSIS — R7303 Prediabetes: Secondary | ICD-10-CM | POA: Diagnosis not present

## 2019-07-13 DIAGNOSIS — Z8546 Personal history of malignant neoplasm of prostate: Secondary | ICD-10-CM | POA: Diagnosis not present

## 2019-07-13 DIAGNOSIS — Z0000 Encounter for general adult medical examination without abnormal findings: Secondary | ICD-10-CM | POA: Diagnosis not present

## 2019-07-13 DIAGNOSIS — E7800 Pure hypercholesterolemia, unspecified: Secondary | ICD-10-CM | POA: Diagnosis not present

## 2019-07-13 DIAGNOSIS — R7303 Prediabetes: Secondary | ICD-10-CM | POA: Diagnosis not present

## 2019-07-13 DIAGNOSIS — I10 Essential (primary) hypertension: Secondary | ICD-10-CM | POA: Diagnosis not present

## 2019-10-18 DIAGNOSIS — Z23 Encounter for immunization: Secondary | ICD-10-CM | POA: Diagnosis not present

## 2019-10-26 DIAGNOSIS — Z23 Encounter for immunization: Secondary | ICD-10-CM | POA: Diagnosis not present

## 2019-11-16 DIAGNOSIS — Z85828 Personal history of other malignant neoplasm of skin: Secondary | ICD-10-CM | POA: Diagnosis not present

## 2019-11-16 DIAGNOSIS — L814 Other melanin hyperpigmentation: Secondary | ICD-10-CM | POA: Diagnosis not present

## 2019-11-16 DIAGNOSIS — D1801 Hemangioma of skin and subcutaneous tissue: Secondary | ICD-10-CM | POA: Diagnosis not present

## 2019-11-16 DIAGNOSIS — L819 Disorder of pigmentation, unspecified: Secondary | ICD-10-CM | POA: Diagnosis not present

## 2019-11-16 DIAGNOSIS — L905 Scar conditions and fibrosis of skin: Secondary | ICD-10-CM | POA: Diagnosis not present

## 2019-11-16 DIAGNOSIS — L570 Actinic keratosis: Secondary | ICD-10-CM | POA: Diagnosis not present

## 2019-11-16 DIAGNOSIS — D229 Melanocytic nevi, unspecified: Secondary | ICD-10-CM | POA: Diagnosis not present

## 2019-11-16 DIAGNOSIS — L821 Other seborrheic keratosis: Secondary | ICD-10-CM | POA: Diagnosis not present

## 2020-01-13 DIAGNOSIS — R7303 Prediabetes: Secondary | ICD-10-CM | POA: Diagnosis not present

## 2020-01-13 DIAGNOSIS — E7800 Pure hypercholesterolemia, unspecified: Secondary | ICD-10-CM | POA: Diagnosis not present

## 2020-01-21 DIAGNOSIS — R7303 Prediabetes: Secondary | ICD-10-CM | POA: Diagnosis not present

## 2020-01-21 DIAGNOSIS — E7800 Pure hypercholesterolemia, unspecified: Secondary | ICD-10-CM | POA: Diagnosis not present

## 2020-01-21 DIAGNOSIS — I4891 Unspecified atrial fibrillation: Secondary | ICD-10-CM | POA: Diagnosis not present

## 2020-01-21 DIAGNOSIS — I10 Essential (primary) hypertension: Secondary | ICD-10-CM | POA: Diagnosis not present

## 2020-01-28 DIAGNOSIS — I4891 Unspecified atrial fibrillation: Secondary | ICD-10-CM | POA: Insufficient documentation

## 2020-01-28 NOTE — Progress Notes (Signed)
Patient referred by Deland Pretty, MD for atrial fibrillation  Subjective:   Jesse Moran, male    DOB: Oct 27, 1940, 80 y.o.   MRN: [id number]   Chief Complaint  Patient presents with  . Atrial Fibrillation  . New Patient (Initial Visit)    Referred by Dr. Shelia Media     HPI  80 y.o. Caucasian male with hypertension, hyperlipidemia, prediabetes, reported  atrial fibrillation  Reviewed outside records.  Patient is retired, but active 80 year old man.  He is on an appendicolith systems, which in future, sold, rented a compressors to large industries, including several hospitals.  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  He stays active with yard work etc.  He denies chest pain, shortness of breath, palpitations, leg edema, orthopnea, PND, TIA/syncope.  Blood pressure is elevated today.  Patient saw cardiologist Dr. Eldred Manges at [facility] in 2020.  I reviewed their notes in detail.  There was reportedly a mention of atrial fibrillation on his chart.  However, Dr. Joan Mayans did not find any tracings showing EKG.  They also perform Zio patch monitoring which again did not show any atrial fibrillation.  EKG from June 2021, and today does not show atrial fibrillation either.  Patient denies any complaints of palpitations and does not know anything but any prior history of atrial fibrillation.  He tells me that Dr. Tamela Gammon office I prescribed him chlorthalidone 25 mg daily for hypertension.  After he ran out of his chlorthalidone, their office would not refill it without a follow-up appointment.  However, their offices moved to [place], 50 miles from [place].  Therefore, patient sought another cardiologist, and was thus referred to [place] by his PCP.  Past Medical History:  Diagnosis Date  . Arthritis    OA  . Cancer ([place]) 2008   PROSTATE  SURGERY DONE ROBOTICALLY  . History of kidney stones YRS AGO   X 1 PASSED STONE  . Hyperlipidemia   . Hypertension      Past Surgical  History:  Procedure Laterality Date  . BACK SURGERY  2000   LOWER  . colonoscopy  04/28/2007 AND 2015  . HERNIA REPAIR  2000    rt. groin  . PROSTATECTOMY  2008   malignant ROBOTICALLY  . TOTAL KNEE ARTHROPLASTY Right 07/09/2017   Procedure: RIGHT TOTAL KNEE ARTHROPLASTY;  Surgeon: Paralee Cancel, MD;  Location: [facility];  Service: Orthopedics;  Laterality: Right;  Adductor Block     Social History   Tobacco Use  Smoking Status Former Smoker  . Types: Cigarettes  . Quit date: 01/22/1958  . Years since quitting: 62.0  Smokeless Tobacco Never Used  Tobacco Comment   SOCIAL NOT MUCH SMOKED    Social History   Substance and Sexual Activity  Alcohol Use Yes  . Alcohol/week: 3.0 standard drinks  . Types: 3 Standard drinks or equivalent per week   Comment: SOCIAL     Family History  Problem Relation Age of Onset  . Kidney disease Father      Current Outpatient Medications on File Prior to Visit  Medication Sig Dispense Refill  . acetaminophen (ACETAMINOPHEN 8 HOUR) 650 MG CR tablet Take 650 mg by mouth every 8 (eight) hours as needed for pain.    Marland Kitchen amLODipine (NORVASC) 5 MG tablet Take 1 tablet by mouth Daily. 5 mg daily    . aspirin 81 MG EC tablet Take 81 mg by mouth daily. Swallow whole.    Marland Kitchen atorvastatin (LIPITOR) 40  MG tablet Take 20 mg by mouth Daily.     . Cholecalciferol (VITAMIN D) 2000 units tablet Take 2,000 Units by mouth daily.    Marland Kitchen docusate sodium (COLACE) 100 MG capsule Take 1 capsule (100 mg total) by mouth 2 (two) times daily. 10 capsule 0  . losartan (COZAAR) 100 MG tablet Take 100 mg by mouth daily.    . metoprolol succinate (TOPROL-XL) 100 MG 24 hr tablet Take 100 mg by mouth Daily.     . Multiple Vitamins-Minerals (MULTIVITAMIN WITH MINERALS) tablet Take 1 tablet by mouth daily.    Marland Kitchen OVER THE COUNTER MEDICATION Take 2 tablets by mouth 3 (three) times daily. Bone up otc supplement    . polyethylene glycol (MIRALAX / GLYCOLAX) packet Take 17 g by mouth 2  (two) times daily. 14 each 0  . Thiamine HCl (VITAMIN B-1) 250 MG tablet Take 250 mg by mouth daily.    . vitamin B-12 (CYANOCOBALAMIN) 500 MCG tablet Take 500 mcg by mouth daily.     No current facility-administered medications on file prior to visit.    Cardiovascular and other pertinent studies:  EKG 01/29/2020: Sinus bradycardia 50 bpm   Recent labs: 01/13/2020: Glucose 115, BUN/Cr 23/1.0. EGFR 76. Na/K 143/4.1. Rest of the CMP normal H/H 15.4/44.4. MCV 97.8. Platelets 208 HbA1C 5.7% Chol 143, TG 113, HDL 38, LDL 82    Review of Systems  Cardiovascular: Negative for chest pain, dyspnea on exertion, leg swelling, palpitations and syncope.         Vitals:   01/29/20 0943  BP: (!) 164/77  Pulse: (!) 55  Resp: 16  SpO2: 96%     Body mass index is 30.71 kg/m. Filed Weights   01/29/20 0943  Weight: 220 lb 3.2 oz (99.9 kg)     Objective:   Physical Exam Vitals and nursing note reviewed.  Constitutional:      General: He is not in acute distress. Neck:     Vascular: No JVD.  Cardiovascular:     Rate and Rhythm: Normal rate and regular rhythm.     Heart sounds: Normal heart sounds. No murmur heard.   Pulmonary:     Effort: Pulmonary effort is normal.     Breath sounds: Normal breath sounds. No wheezing or rales.          Assessment & Recommendations:   80 y.o. Caucasian male with hypertension, hyperlipidemia, prediabetes  Hypertension: Blood pressure elevated today.  He has been out of his chlorthalidone for a while.  Recommend switching to losartan-hydrochlorothiazide 100-25 mg daily, to reduce number of pills.  Recommend checking BMP in 1-2 weeks.  He would like to get this checked at his PCPs office.  Of note, atrial fibrillation has been erroneously been part of his medical records for long time.  There is no EKG or other evidence of atrial fibrillation.  He does not need anticoagulation.  In absence of any known atherosclerotic cardiovascular  disease history, I also discontinued his aspirin.  I will see him on as-needed basis.   Thank you for referring the patient to [place]. Please feel free to contact with any questions.   Nigel Mormon, MD Pager: [phone] Office: [phone]

## 2020-01-29 ENCOUNTER — Encounter: Payer: Self-pay | Admitting: Cardiology

## 2020-01-29 ENCOUNTER — Other Ambulatory Visit: Payer: Self-pay

## 2020-01-29 ENCOUNTER — Ambulatory Visit: Payer: Medicare Other | Admitting: Cardiology

## 2020-01-29 VITALS — BP 164/77 | HR 55 | Resp 16 | Ht 71.0 in | Wt 220.2 lb

## 2020-01-29 DIAGNOSIS — I10 Essential (primary) hypertension: Secondary | ICD-10-CM | POA: Insufficient documentation

## 2020-01-29 DIAGNOSIS — I4891 Unspecified atrial fibrillation: Secondary | ICD-10-CM

## 2020-01-29 MED ORDER — LOSARTAN POTASSIUM-HCTZ 100-25 MG PO TABS: 1.0000 | ORAL_TABLET | Freq: Every day | ORAL | 3 refills | Status: DC

## 2020-02-01 ENCOUNTER — Ambulatory Visit: Payer: Self-pay | Admitting: Cardiology

## 2020-02-11 ENCOUNTER — Ambulatory Visit: Payer: Medicare Other

## 2020-02-11 ENCOUNTER — Other Ambulatory Visit: Payer: Self-pay

## 2020-02-11 DIAGNOSIS — I10 Essential (primary) hypertension: Secondary | ICD-10-CM

## 2020-02-15 NOTE — Progress Notes (Signed)
No answer left a vm

## 2020-02-16 DIAGNOSIS — E7800 Pure hypercholesterolemia, unspecified: Secondary | ICD-10-CM | POA: Diagnosis not present

## 2020-05-12 ENCOUNTER — Other Ambulatory Visit: Payer: Self-pay | Admitting: Cardiology

## 2020-05-12 DIAGNOSIS — I10 Essential (primary) hypertension: Secondary | ICD-10-CM

## 2020-05-16 DIAGNOSIS — L570 Actinic keratosis: Secondary | ICD-10-CM | POA: Diagnosis not present

## 2020-05-16 DIAGNOSIS — L814 Other melanin hyperpigmentation: Secondary | ICD-10-CM | POA: Diagnosis not present

## 2020-05-16 DIAGNOSIS — L821 Other seborrheic keratosis: Secondary | ICD-10-CM | POA: Diagnosis not present

## 2020-05-16 DIAGNOSIS — D229 Melanocytic nevi, unspecified: Secondary | ICD-10-CM | POA: Diagnosis not present

## 2020-05-16 DIAGNOSIS — L718 Other rosacea: Secondary | ICD-10-CM | POA: Diagnosis not present

## 2020-05-16 DIAGNOSIS — L819 Disorder of pigmentation, unspecified: Secondary | ICD-10-CM | POA: Diagnosis not present

## 2020-06-15 DIAGNOSIS — Z23 Encounter for immunization: Secondary | ICD-10-CM | POA: Diagnosis not present

## 2020-06-21 DIAGNOSIS — E7800 Pure hypercholesterolemia, unspecified: Secondary | ICD-10-CM | POA: Diagnosis not present

## 2020-06-21 DIAGNOSIS — K219 Gastro-esophageal reflux disease without esophagitis: Secondary | ICD-10-CM | POA: Diagnosis not present

## 2020-06-21 DIAGNOSIS — I10 Essential (primary) hypertension: Secondary | ICD-10-CM | POA: Diagnosis not present

## 2020-07-11 DIAGNOSIS — E7800 Pure hypercholesterolemia, unspecified: Secondary | ICD-10-CM | POA: Diagnosis not present

## 2020-07-11 DIAGNOSIS — R7303 Prediabetes: Secondary | ICD-10-CM | POA: Diagnosis not present

## 2020-07-11 DIAGNOSIS — Z0000 Encounter for general adult medical examination without abnormal findings: Secondary | ICD-10-CM | POA: Diagnosis not present

## 2020-07-11 DIAGNOSIS — Z8546 Personal history of malignant neoplasm of prostate: Secondary | ICD-10-CM | POA: Diagnosis not present

## 2020-07-11 DIAGNOSIS — I10 Essential (primary) hypertension: Secondary | ICD-10-CM | POA: Diagnosis not present

## 2020-07-11 DIAGNOSIS — Z125 Encounter for screening for malignant neoplasm of prostate: Secondary | ICD-10-CM | POA: Diagnosis not present

## 2020-07-14 DIAGNOSIS — E7800 Pure hypercholesterolemia, unspecified: Secondary | ICD-10-CM | POA: Diagnosis not present

## 2020-07-14 DIAGNOSIS — R7303 Prediabetes: Secondary | ICD-10-CM | POA: Diagnosis not present

## 2020-07-14 DIAGNOSIS — M25561 Pain in right knee: Secondary | ICD-10-CM | POA: Diagnosis not present

## 2020-07-14 DIAGNOSIS — E559 Vitamin D deficiency, unspecified: Secondary | ICD-10-CM | POA: Diagnosis not present

## 2020-07-14 DIAGNOSIS — I10 Essential (primary) hypertension: Secondary | ICD-10-CM | POA: Diagnosis not present

## 2020-07-14 DIAGNOSIS — Z0000 Encounter for general adult medical examination without abnormal findings: Secondary | ICD-10-CM | POA: Diagnosis not present

## 2020-07-21 DIAGNOSIS — E7800 Pure hypercholesterolemia, unspecified: Secondary | ICD-10-CM | POA: Diagnosis not present

## 2020-07-21 DIAGNOSIS — K219 Gastro-esophageal reflux disease without esophagitis: Secondary | ICD-10-CM | POA: Diagnosis not present

## 2020-07-21 DIAGNOSIS — I4891 Unspecified atrial fibrillation: Secondary | ICD-10-CM | POA: Diagnosis not present

## 2020-07-21 DIAGNOSIS — I10 Essential (primary) hypertension: Secondary | ICD-10-CM | POA: Diagnosis not present

## 2020-08-21 DIAGNOSIS — E7800 Pure hypercholesterolemia, unspecified: Secondary | ICD-10-CM | POA: Diagnosis not present

## 2020-08-21 DIAGNOSIS — K219 Gastro-esophageal reflux disease without esophagitis: Secondary | ICD-10-CM | POA: Diagnosis not present

## 2020-08-21 DIAGNOSIS — I4891 Unspecified atrial fibrillation: Secondary | ICD-10-CM | POA: Diagnosis not present

## 2020-08-21 DIAGNOSIS — I10 Essential (primary) hypertension: Secondary | ICD-10-CM | POA: Diagnosis not present

## 2020-09-17 ENCOUNTER — Other Ambulatory Visit: Payer: Self-pay | Admitting: Cardiology

## 2020-09-17 DIAGNOSIS — I10 Essential (primary) hypertension: Secondary | ICD-10-CM

## 2020-09-20 DIAGNOSIS — Z1152 Encounter for screening for COVID-19: Secondary | ICD-10-CM | POA: Diagnosis not present

## 2020-09-21 DIAGNOSIS — K219 Gastro-esophageal reflux disease without esophagitis: Secondary | ICD-10-CM | POA: Diagnosis not present

## 2020-09-21 DIAGNOSIS — E7800 Pure hypercholesterolemia, unspecified: Secondary | ICD-10-CM | POA: Diagnosis not present

## 2020-09-21 DIAGNOSIS — I10 Essential (primary) hypertension: Secondary | ICD-10-CM | POA: Diagnosis not present

## 2020-09-21 DIAGNOSIS — I4891 Unspecified atrial fibrillation: Secondary | ICD-10-CM | POA: Diagnosis not present

## 2020-09-27 DIAGNOSIS — I10 Essential (primary) hypertension: Secondary | ICD-10-CM | POA: Diagnosis not present

## 2020-09-30 ENCOUNTER — Other Ambulatory Visit: Payer: Self-pay | Admitting: Internal Medicine

## 2020-09-30 DIAGNOSIS — E7800 Pure hypercholesterolemia, unspecified: Secondary | ICD-10-CM

## 2020-10-21 DIAGNOSIS — I4891 Unspecified atrial fibrillation: Secondary | ICD-10-CM | POA: Diagnosis not present

## 2020-10-21 DIAGNOSIS — E7800 Pure hypercholesterolemia, unspecified: Secondary | ICD-10-CM | POA: Diagnosis not present

## 2020-10-21 DIAGNOSIS — K219 Gastro-esophageal reflux disease without esophagitis: Secondary | ICD-10-CM | POA: Diagnosis not present

## 2020-10-21 DIAGNOSIS — I10 Essential (primary) hypertension: Secondary | ICD-10-CM | POA: Diagnosis not present

## 2020-10-24 ENCOUNTER — Ambulatory Visit
Admission: RE | Admit: 2020-10-24 | Discharge: 2020-10-24 | Disposition: A | Discharge status: Home / Self Care | Payer: Self-pay | Source: Ambulatory Visit | Attending: Internal Medicine | Admitting: Internal Medicine

## 2020-10-24 DIAGNOSIS — E7800 Pure hypercholesterolemia, unspecified: Secondary | ICD-10-CM

## 2020-10-24 DIAGNOSIS — K7689 Other specified diseases of liver: Secondary | ICD-10-CM | POA: Diagnosis not present

## 2020-10-24 DIAGNOSIS — I700 Atherosclerosis of aorta: Secondary | ICD-10-CM | POA: Diagnosis not present

## 2020-10-27 DIAGNOSIS — I700 Atherosclerosis of aorta: Secondary | ICD-10-CM | POA: Diagnosis not present

## 2020-10-27 DIAGNOSIS — I2584 Coronary atherosclerosis due to calcified coronary lesion: Secondary | ICD-10-CM | POA: Diagnosis not present

## 2020-10-27 DIAGNOSIS — R918 Other nonspecific abnormal finding of lung field: Secondary | ICD-10-CM | POA: Diagnosis not present

## 2020-10-27 DIAGNOSIS — I2510 Atherosclerotic heart disease of native coronary artery without angina pectoris: Secondary | ICD-10-CM | POA: Diagnosis not present

## 2020-11-14 DIAGNOSIS — Z85828 Personal history of other malignant neoplasm of skin: Secondary | ICD-10-CM | POA: Diagnosis not present

## 2020-11-14 DIAGNOSIS — Z08 Encounter for follow-up examination after completed treatment for malignant neoplasm: Secondary | ICD-10-CM | POA: Diagnosis not present

## 2020-11-14 DIAGNOSIS — L570 Actinic keratosis: Secondary | ICD-10-CM | POA: Diagnosis not present

## 2020-11-14 DIAGNOSIS — L819 Disorder of pigmentation, unspecified: Secondary | ICD-10-CM | POA: Diagnosis not present

## 2020-11-15 DIAGNOSIS — Z1152 Encounter for screening for COVID-19: Secondary | ICD-10-CM | POA: Diagnosis not present

## 2020-11-21 DIAGNOSIS — I4891 Unspecified atrial fibrillation: Secondary | ICD-10-CM | POA: Diagnosis not present

## 2020-11-21 DIAGNOSIS — K219 Gastro-esophageal reflux disease without esophagitis: Secondary | ICD-10-CM | POA: Diagnosis not present

## 2020-11-21 DIAGNOSIS — I10 Essential (primary) hypertension: Secondary | ICD-10-CM | POA: Diagnosis not present

## 2020-11-21 DIAGNOSIS — E7800 Pure hypercholesterolemia, unspecified: Secondary | ICD-10-CM | POA: Diagnosis not present

## 2020-12-01 DIAGNOSIS — I700 Atherosclerosis of aorta: Secondary | ICD-10-CM | POA: Diagnosis not present

## 2020-12-21 DIAGNOSIS — I4891 Unspecified atrial fibrillation: Secondary | ICD-10-CM | POA: Diagnosis not present

## 2020-12-21 DIAGNOSIS — E7800 Pure hypercholesterolemia, unspecified: Secondary | ICD-10-CM | POA: Diagnosis not present

## 2020-12-21 DIAGNOSIS — K219 Gastro-esophageal reflux disease without esophagitis: Secondary | ICD-10-CM | POA: Diagnosis not present

## 2020-12-21 DIAGNOSIS — I10 Essential (primary) hypertension: Secondary | ICD-10-CM | POA: Diagnosis not present

## 2020-12-21 DIAGNOSIS — Z1152 Encounter for screening for COVID-19: Secondary | ICD-10-CM | POA: Diagnosis not present

## 2021-01-20 DIAGNOSIS — I10 Essential (primary) hypertension: Secondary | ICD-10-CM | POA: Diagnosis not present

## 2021-01-20 DIAGNOSIS — E7800 Pure hypercholesterolemia, unspecified: Secondary | ICD-10-CM | POA: Diagnosis not present

## 2021-01-20 DIAGNOSIS — I4891 Unspecified atrial fibrillation: Secondary | ICD-10-CM | POA: Diagnosis not present

## 2021-01-20 DIAGNOSIS — K219 Gastro-esophageal reflux disease without esophagitis: Secondary | ICD-10-CM | POA: Diagnosis not present

## 2021-01-22 DIAGNOSIS — Z20828 Contact with and (suspected) exposure to other viral communicable diseases: Secondary | ICD-10-CM | POA: Diagnosis not present

## 2021-01-25 DIAGNOSIS — E7800 Pure hypercholesterolemia, unspecified: Secondary | ICD-10-CM | POA: Diagnosis not present

## 2021-01-25 DIAGNOSIS — E559 Vitamin D deficiency, unspecified: Secondary | ICD-10-CM | POA: Diagnosis not present

## 2021-01-25 DIAGNOSIS — R7303 Prediabetes: Secondary | ICD-10-CM | POA: Diagnosis not present

## 2021-02-01 DIAGNOSIS — R600 Localized edema: Secondary | ICD-10-CM | POA: Diagnosis not present

## 2021-02-01 DIAGNOSIS — R7303 Prediabetes: Secondary | ICD-10-CM | POA: Diagnosis not present

## 2021-02-01 DIAGNOSIS — I2584 Coronary atherosclerosis due to calcified coronary lesion: Secondary | ICD-10-CM | POA: Diagnosis not present

## 2021-02-01 DIAGNOSIS — E559 Vitamin D deficiency, unspecified: Secondary | ICD-10-CM | POA: Diagnosis not present

## 2021-02-01 DIAGNOSIS — E7800 Pure hypercholesterolemia, unspecified: Secondary | ICD-10-CM | POA: Diagnosis not present

## 2021-02-01 DIAGNOSIS — I10 Essential (primary) hypertension: Secondary | ICD-10-CM | POA: Diagnosis not present

## 2021-02-02 ENCOUNTER — Other Ambulatory Visit: Payer: Self-pay | Admitting: Internal Medicine

## 2021-02-02 DIAGNOSIS — R918 Other nonspecific abnormal finding of lung field: Secondary | ICD-10-CM

## 2021-02-09 DIAGNOSIS — I10 Essential (primary) hypertension: Secondary | ICD-10-CM | POA: Diagnosis not present

## 2021-02-09 DIAGNOSIS — R600 Localized edema: Secondary | ICD-10-CM | POA: Diagnosis not present

## 2021-02-21 ENCOUNTER — Ambulatory Visit
Admission: RE | Admit: 2021-02-21 | Discharge: 2021-02-21 | Disposition: A | Discharge status: Home / Self Care | Payer: Medicare Other | Source: Ambulatory Visit | Attending: Internal Medicine | Admitting: Internal Medicine

## 2021-02-21 DIAGNOSIS — R911 Solitary pulmonary nodule: Secondary | ICD-10-CM | POA: Diagnosis not present

## 2021-02-21 DIAGNOSIS — I10 Essential (primary) hypertension: Secondary | ICD-10-CM | POA: Diagnosis not present

## 2021-02-21 DIAGNOSIS — I4891 Unspecified atrial fibrillation: Secondary | ICD-10-CM | POA: Diagnosis not present

## 2021-02-21 DIAGNOSIS — R918 Other nonspecific abnormal finding of lung field: Secondary | ICD-10-CM

## 2021-02-21 DIAGNOSIS — E7800 Pure hypercholesterolemia, unspecified: Secondary | ICD-10-CM | POA: Diagnosis not present

## 2021-02-21 DIAGNOSIS — K219 Gastro-esophageal reflux disease without esophagitis: Secondary | ICD-10-CM | POA: Diagnosis not present

## 2021-03-02 DIAGNOSIS — M1611 Unilateral primary osteoarthritis, right hip: Secondary | ICD-10-CM | POA: Diagnosis not present

## 2021-03-02 DIAGNOSIS — Z96651 Presence of right artificial knee joint: Secondary | ICD-10-CM | POA: Diagnosis not present

## 2021-03-02 DIAGNOSIS — M25551 Pain in right hip: Secondary | ICD-10-CM | POA: Diagnosis not present

## 2021-03-02 DIAGNOSIS — M25561 Pain in right knee: Secondary | ICD-10-CM | POA: Diagnosis not present

## 2021-03-02 DIAGNOSIS — T8484XA Pain due to internal orthopedic prosthetic devices, implants and grafts, initial encounter: Secondary | ICD-10-CM | POA: Diagnosis not present

## 2021-03-09 DIAGNOSIS — M1611 Unilateral primary osteoarthritis, right hip: Secondary | ICD-10-CM | POA: Diagnosis not present

## 2021-03-09 DIAGNOSIS — Z96651 Presence of right artificial knee joint: Secondary | ICD-10-CM | POA: Diagnosis not present

## 2021-03-09 DIAGNOSIS — I10 Essential (primary) hypertension: Secondary | ICD-10-CM | POA: Diagnosis not present

## 2021-03-14 DIAGNOSIS — Z01818 Encounter for other preprocedural examination: Secondary | ICD-10-CM | POA: Diagnosis not present

## 2021-03-21 DIAGNOSIS — E7800 Pure hypercholesterolemia, unspecified: Secondary | ICD-10-CM | POA: Diagnosis not present

## 2021-03-21 DIAGNOSIS — I10 Essential (primary) hypertension: Secondary | ICD-10-CM | POA: Diagnosis not present

## 2021-03-21 DIAGNOSIS — I4891 Unspecified atrial fibrillation: Secondary | ICD-10-CM | POA: Diagnosis not present

## 2021-03-21 DIAGNOSIS — K219 Gastro-esophageal reflux disease without esophagitis: Secondary | ICD-10-CM | POA: Diagnosis not present

## 2021-03-28 DIAGNOSIS — Z96651 Presence of right artificial knee joint: Secondary | ICD-10-CM | POA: Diagnosis not present

## 2021-03-28 DIAGNOSIS — I10 Essential (primary) hypertension: Secondary | ICD-10-CM | POA: Diagnosis not present

## 2021-03-28 DIAGNOSIS — C61 Malignant neoplasm of prostate: Secondary | ICD-10-CM | POA: Diagnosis not present

## 2021-03-28 DIAGNOSIS — M1611 Unilateral primary osteoarthritis, right hip: Secondary | ICD-10-CM | POA: Diagnosis not present

## 2021-03-31 DIAGNOSIS — R262 Difficulty in walking, not elsewhere classified: Secondary | ICD-10-CM | POA: Diagnosis not present

## 2021-03-31 DIAGNOSIS — M25551 Pain in right hip: Secondary | ICD-10-CM | POA: Diagnosis not present

## 2021-04-04 DIAGNOSIS — Z471 Aftercare following joint replacement surgery: Secondary | ICD-10-CM | POA: Diagnosis not present

## 2021-04-04 DIAGNOSIS — Z96641 Presence of right artificial hip joint: Secondary | ICD-10-CM | POA: Diagnosis not present

## 2021-04-11 DIAGNOSIS — Z20828 Contact with and (suspected) exposure to other viral communicable diseases: Secondary | ICD-10-CM | POA: Diagnosis not present

## 2021-04-11 DIAGNOSIS — Z1152 Encounter for screening for COVID-19: Secondary | ICD-10-CM | POA: Diagnosis not present

## 2021-04-21 DIAGNOSIS — I4891 Unspecified atrial fibrillation: Secondary | ICD-10-CM | POA: Diagnosis not present

## 2021-04-21 DIAGNOSIS — E7800 Pure hypercholesterolemia, unspecified: Secondary | ICD-10-CM | POA: Diagnosis not present

## 2021-04-21 DIAGNOSIS — K219 Gastro-esophageal reflux disease without esophagitis: Secondary | ICD-10-CM | POA: Diagnosis not present

## 2021-04-21 DIAGNOSIS — I10 Essential (primary) hypertension: Secondary | ICD-10-CM | POA: Diagnosis not present

## 2021-05-05 DIAGNOSIS — Z20822 Contact with and (suspected) exposure to covid-19: Secondary | ICD-10-CM | POA: Diagnosis not present

## 2021-05-08 DIAGNOSIS — Z96641 Presence of right artificial hip joint: Secondary | ICD-10-CM | POA: Diagnosis not present

## 2021-05-21 DIAGNOSIS — K219 Gastro-esophageal reflux disease without esophagitis: Secondary | ICD-10-CM | POA: Diagnosis not present

## 2021-05-21 DIAGNOSIS — I10 Essential (primary) hypertension: Secondary | ICD-10-CM | POA: Diagnosis not present

## 2021-05-21 DIAGNOSIS — I4891 Unspecified atrial fibrillation: Secondary | ICD-10-CM | POA: Diagnosis not present

## 2021-05-21 DIAGNOSIS — E7800 Pure hypercholesterolemia, unspecified: Secondary | ICD-10-CM | POA: Diagnosis not present

## 2021-05-22 DIAGNOSIS — Z20822 Contact with and (suspected) exposure to covid-19: Secondary | ICD-10-CM | POA: Diagnosis not present

## 2021-06-14 DIAGNOSIS — Z20828 Contact with and (suspected) exposure to other viral communicable diseases: Secondary | ICD-10-CM | POA: Diagnosis not present

## 2021-06-20 DIAGNOSIS — Z20828 Contact with and (suspected) exposure to other viral communicable diseases: Secondary | ICD-10-CM | POA: Diagnosis not present

## 2021-06-21 DIAGNOSIS — I4891 Unspecified atrial fibrillation: Secondary | ICD-10-CM | POA: Diagnosis not present

## 2021-06-21 DIAGNOSIS — I10 Essential (primary) hypertension: Secondary | ICD-10-CM | POA: Diagnosis not present

## 2021-06-21 DIAGNOSIS — E7800 Pure hypercholesterolemia, unspecified: Secondary | ICD-10-CM | POA: Diagnosis not present

## 2021-06-29 DIAGNOSIS — L570 Actinic keratosis: Secondary | ICD-10-CM | POA: Diagnosis not present

## 2021-06-29 DIAGNOSIS — D225 Melanocytic nevi of trunk: Secondary | ICD-10-CM | POA: Diagnosis not present

## 2021-06-29 DIAGNOSIS — L821 Other seborrheic keratosis: Secondary | ICD-10-CM | POA: Diagnosis not present

## 2021-06-29 DIAGNOSIS — L814 Other melanin hyperpigmentation: Secondary | ICD-10-CM | POA: Diagnosis not present

## 2021-07-11 DIAGNOSIS — E559 Vitamin D deficiency, unspecified: Secondary | ICD-10-CM | POA: Diagnosis not present

## 2021-07-11 DIAGNOSIS — R7303 Prediabetes: Secondary | ICD-10-CM | POA: Diagnosis not present

## 2021-07-11 DIAGNOSIS — E7801 Familial hypercholesterolemia: Secondary | ICD-10-CM | POA: Diagnosis not present

## 2021-07-11 DIAGNOSIS — I2584 Coronary atherosclerosis due to calcified coronary lesion: Secondary | ICD-10-CM | POA: Diagnosis not present

## 2021-07-18 DIAGNOSIS — E7800 Pure hypercholesterolemia, unspecified: Secondary | ICD-10-CM | POA: Diagnosis not present

## 2021-07-18 DIAGNOSIS — I700 Atherosclerosis of aorta: Secondary | ICD-10-CM | POA: Diagnosis not present

## 2021-07-18 DIAGNOSIS — R918 Other nonspecific abnormal finding of lung field: Secondary | ICD-10-CM | POA: Diagnosis not present

## 2021-07-18 DIAGNOSIS — I10 Essential (primary) hypertension: Secondary | ICD-10-CM | POA: Diagnosis not present

## 2021-07-18 DIAGNOSIS — I2510 Atherosclerotic heart disease of native coronary artery without angina pectoris: Secondary | ICD-10-CM | POA: Diagnosis not present

## 2021-07-18 DIAGNOSIS — R7303 Prediabetes: Secondary | ICD-10-CM | POA: Diagnosis not present

## 2021-07-18 DIAGNOSIS — Z0000 Encounter for general adult medical examination without abnormal findings: Secondary | ICD-10-CM | POA: Diagnosis not present

## 2021-10-05 DIAGNOSIS — Z96641 Presence of right artificial hip joint: Secondary | ICD-10-CM | POA: Diagnosis not present

## 2021-12-25 DIAGNOSIS — E7800 Pure hypercholesterolemia, unspecified: Secondary | ICD-10-CM | POA: Diagnosis not present

## 2021-12-25 DIAGNOSIS — R7303 Prediabetes: Secondary | ICD-10-CM | POA: Diagnosis not present

## 2021-12-25 DIAGNOSIS — Z0000 Encounter for general adult medical examination without abnormal findings: Secondary | ICD-10-CM | POA: Diagnosis not present

## 2022-01-01 DIAGNOSIS — L821 Other seborrheic keratosis: Secondary | ICD-10-CM | POA: Diagnosis not present

## 2022-01-01 DIAGNOSIS — C44729 Squamous cell carcinoma of skin of left lower limb, including hip: Secondary | ICD-10-CM | POA: Diagnosis not present

## 2022-01-01 DIAGNOSIS — D492 Neoplasm of unspecified behavior of bone, soft tissue, and skin: Secondary | ICD-10-CM | POA: Diagnosis not present

## 2022-01-01 DIAGNOSIS — Z08 Encounter for follow-up examination after completed treatment for malignant neoplasm: Secondary | ICD-10-CM | POA: Diagnosis not present

## 2022-01-01 DIAGNOSIS — C44619 Basal cell carcinoma of skin of left upper limb, including shoulder: Secondary | ICD-10-CM | POA: Diagnosis not present

## 2022-01-01 DIAGNOSIS — D225 Melanocytic nevi of trunk: Secondary | ICD-10-CM | POA: Diagnosis not present

## 2022-01-01 DIAGNOSIS — C44719 Basal cell carcinoma of skin of left lower limb, including hip: Secondary | ICD-10-CM | POA: Diagnosis not present

## 2022-01-01 DIAGNOSIS — E875 Hyperkalemia: Secondary | ICD-10-CM | POA: Diagnosis not present

## 2022-01-01 DIAGNOSIS — L578 Other skin changes due to chronic exposure to nonionizing radiation: Secondary | ICD-10-CM | POA: Diagnosis not present

## 2022-01-01 DIAGNOSIS — R7303 Prediabetes: Secondary | ICD-10-CM | POA: Diagnosis not present

## 2022-01-01 DIAGNOSIS — Z85828 Personal history of other malignant neoplasm of skin: Secondary | ICD-10-CM | POA: Diagnosis not present

## 2022-01-01 DIAGNOSIS — L814 Other melanin hyperpigmentation: Secondary | ICD-10-CM | POA: Diagnosis not present

## 2022-01-01 DIAGNOSIS — L570 Actinic keratosis: Secondary | ICD-10-CM | POA: Diagnosis not present

## 2022-01-08 DIAGNOSIS — D492 Neoplasm of unspecified behavior of bone, soft tissue, and skin: Secondary | ICD-10-CM | POA: Diagnosis not present

## 2022-02-07 DIAGNOSIS — C44729 Squamous cell carcinoma of skin of left lower limb, including hip: Secondary | ICD-10-CM | POA: Diagnosis not present

## 2022-02-07 DIAGNOSIS — I872 Venous insufficiency (chronic) (peripheral): Secondary | ICD-10-CM | POA: Diagnosis not present

## 2022-02-21 DIAGNOSIS — C44719 Basal cell carcinoma of skin of left lower limb, including hip: Secondary | ICD-10-CM | POA: Diagnosis not present

## 2022-02-21 DIAGNOSIS — C44619 Basal cell carcinoma of skin of left upper limb, including shoulder: Secondary | ICD-10-CM | POA: Diagnosis not present

## 2022-02-21 DIAGNOSIS — I872 Venous insufficiency (chronic) (peripheral): Secondary | ICD-10-CM | POA: Diagnosis not present

## 2022-02-21 DIAGNOSIS — Z48817 Encounter for surgical aftercare following surgery on the skin and subcutaneous tissue: Secondary | ICD-10-CM | POA: Diagnosis not present

## 2022-02-21 DIAGNOSIS — Z4801 Encounter for change or removal of surgical wound dressing: Secondary | ICD-10-CM | POA: Diagnosis not present

## 2022-03-07 DIAGNOSIS — C44619 Basal cell carcinoma of skin of left upper limb, including shoulder: Secondary | ICD-10-CM | POA: Diagnosis not present

## 2022-03-12 DIAGNOSIS — L570 Actinic keratosis: Secondary | ICD-10-CM | POA: Diagnosis not present

## 2022-04-09 DIAGNOSIS — L578 Other skin changes due to chronic exposure to nonionizing radiation: Secondary | ICD-10-CM | POA: Diagnosis not present

## 2022-04-09 DIAGNOSIS — L570 Actinic keratosis: Secondary | ICD-10-CM | POA: Diagnosis not present

## 2022-06-04 DIAGNOSIS — H35033 Hypertensive retinopathy, bilateral: Secondary | ICD-10-CM | POA: Diagnosis not present

## 2022-06-04 DIAGNOSIS — H2513 Age-related nuclear cataract, bilateral: Secondary | ICD-10-CM | POA: Diagnosis not present

## 2022-07-25 DIAGNOSIS — I10 Essential (primary) hypertension: Secondary | ICD-10-CM | POA: Diagnosis not present

## 2022-07-25 DIAGNOSIS — Z125 Encounter for screening for malignant neoplasm of prostate: Secondary | ICD-10-CM | POA: Diagnosis not present

## 2022-07-25 DIAGNOSIS — R7303 Prediabetes: Secondary | ICD-10-CM | POA: Diagnosis not present

## 2022-07-26 IMAGING — CT CT CARDIAC CORONARY ARTERY CALCIUM SCORE
3 series · 14 of 20 positions shown, 16 images · non-contrast
Comparison: None

CLINICAL DATA: A 80-year-old white male presents with elevated
cholesterol for coronary artery calcium scoring.

EXAM:
CT CARDIAC CORONARY ARTERY CALCIUM SCORE
TECHNIQUE: Non-contrast imaging through the heart was performed using
prospective ECG gating. Image post processing was performed on an
independent workstation, allowing for quantitative analysis of the
heart and coronary arteries. Note that this exam targets the heart
and the chest was not imaged in its entirety.

[Series 2: calcium scoring 2.00 qr36 bestdiast 60% hrt calciu · axial · 0.38mm/px · z∈[+1699,+1771]mm · 4 of 60 slices shown]
[im 12/60  vessel]
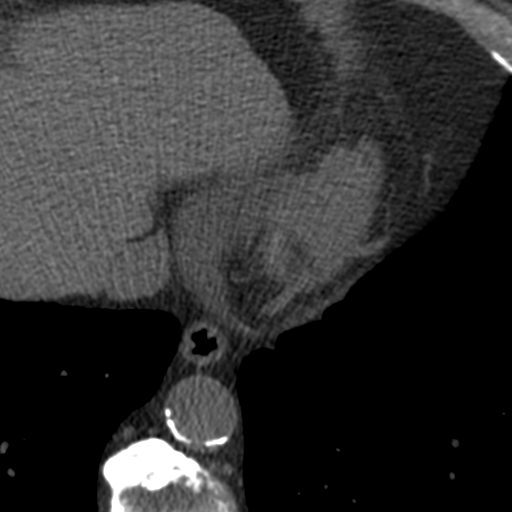
[im 24/60  vessel]
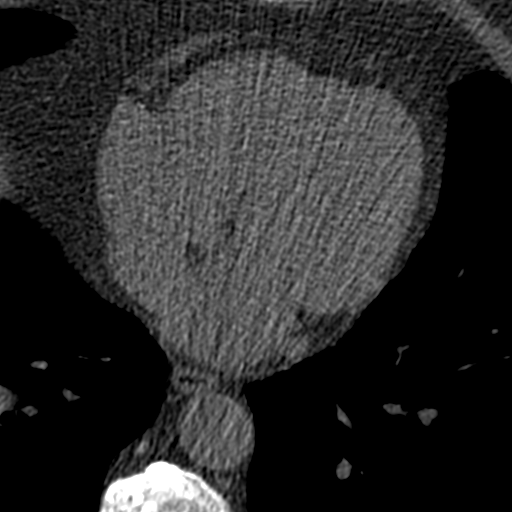
[im 36/60  vessel]
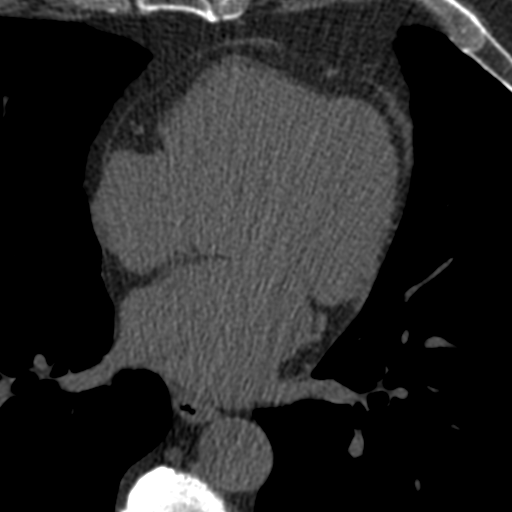
[im 48/60  vessel]
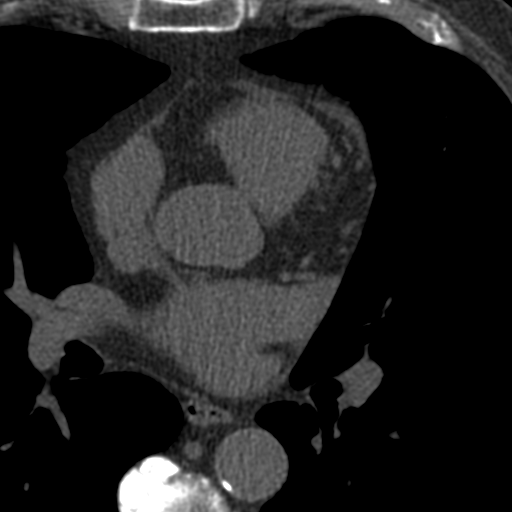

[Series 3: calcium scoring 2.00 br40 bestdiast 60% axial · axial · 0.66mm/px · z∈[+1695,+1775]mm · 5 of 60 slices shown, 7 images]
[im 10/60  vessel]
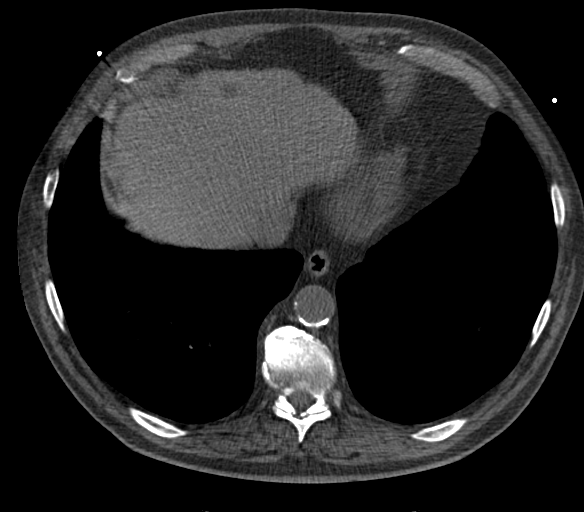
[im 10/60  lung]
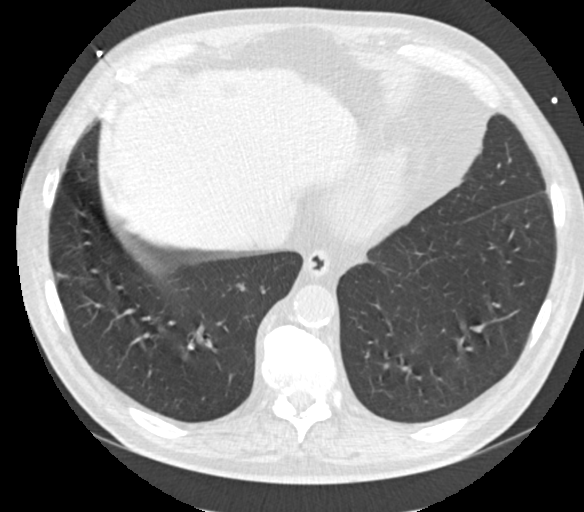
[im 20/60  vessel]
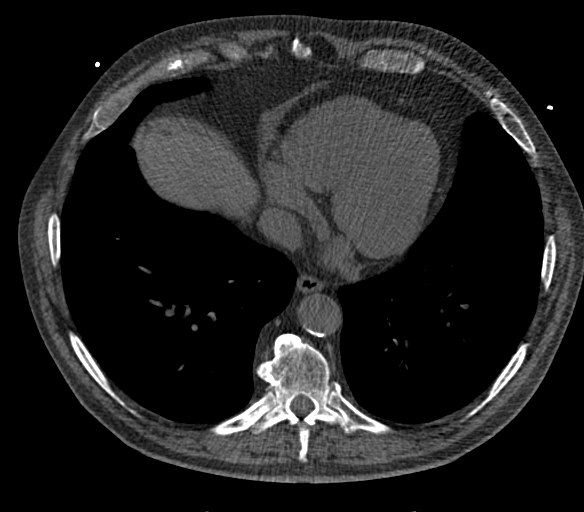
[im 30/60  vessel]
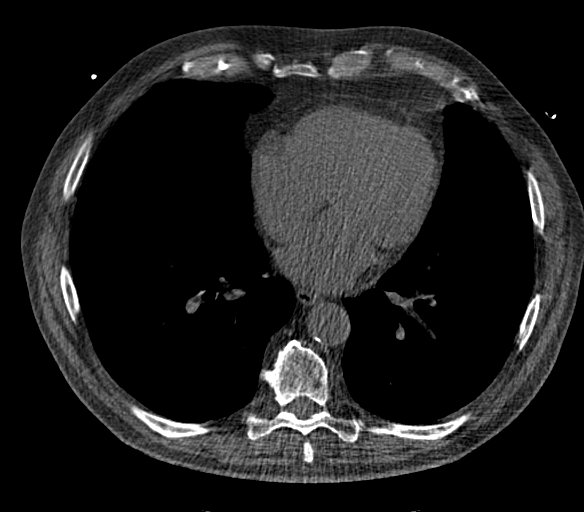
[im 40/60  vessel]
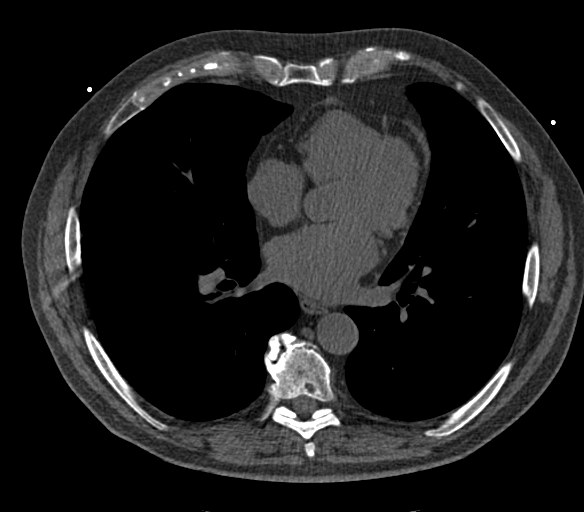
[im 50/60  vessel]
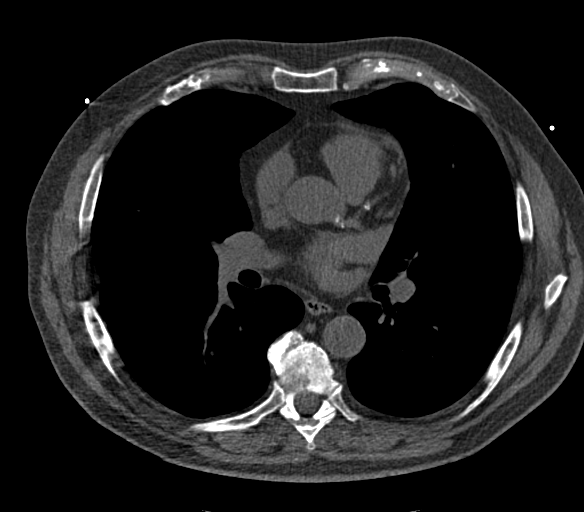
[im 50/60  lung]
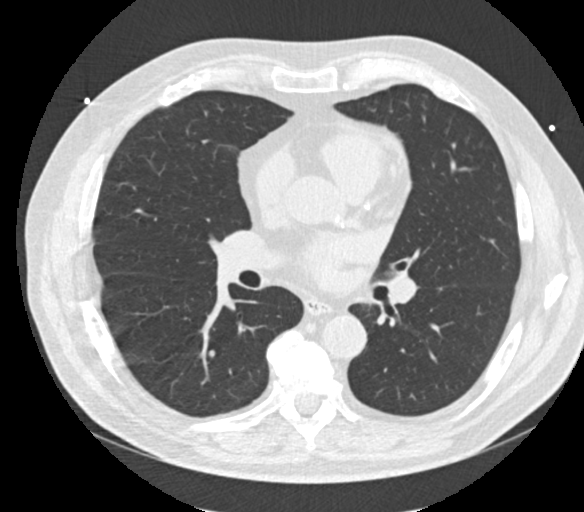

[Series 9: calcium scoring 2.00 br60 bestdiast 60% lungs · axial · 0.37mm/px · z∈[+1695,+1775]mm · 5 of 60 slices shown]
[im 10/60  vessel]
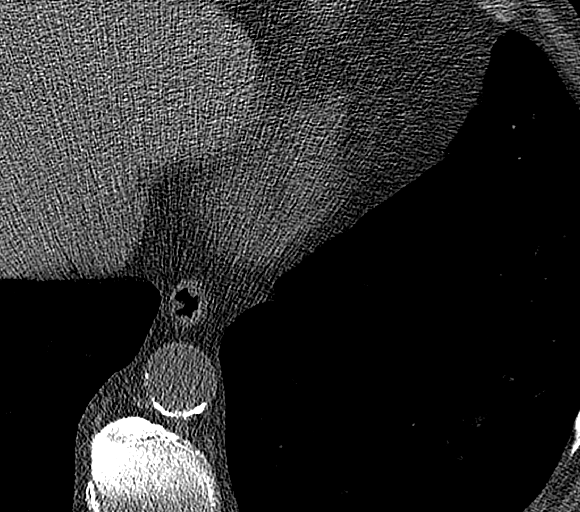
[im 20/60  vessel]
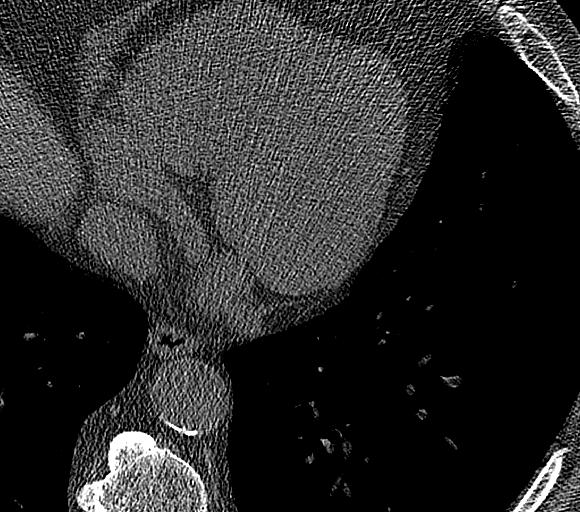
[im 30/60  vessel]
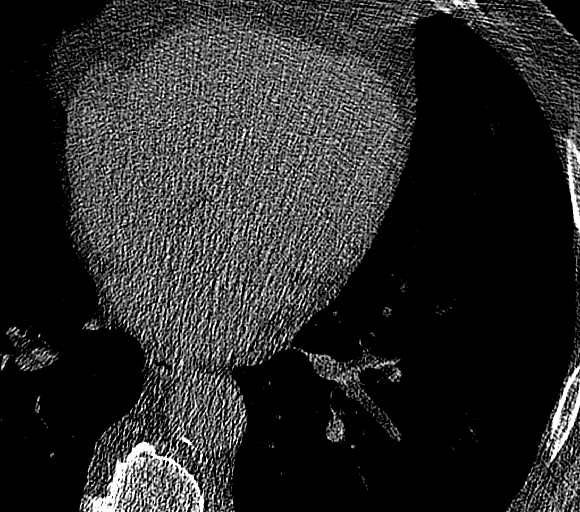
[im 40/60  vessel]
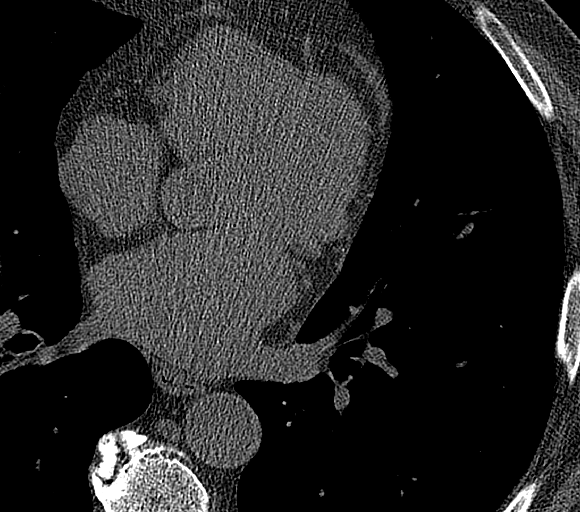
[im 50/60  vessel]
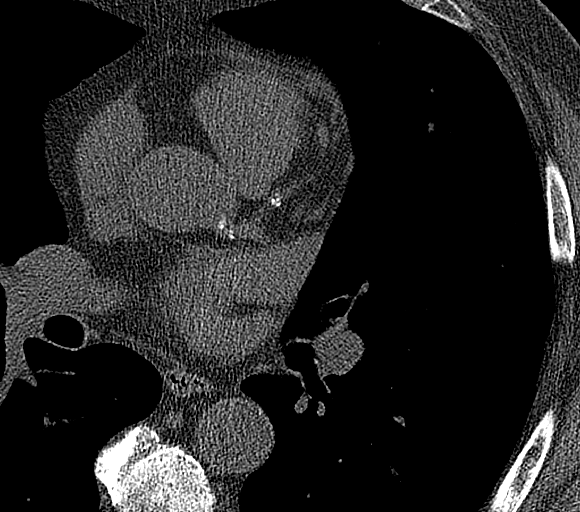

[14 of 20 positions shown; findings below may reference images not displayed]

FINDINGS: CORONARY CALCIUM SCORES:

Left Main:

LAD:

LCx: 0

RCA:

Total Agatston Score: 141

[HOSPITAL] percentile: 33

AORTA MEASUREMENTS:

Ascending Aorta: 34 mm

Descending Aorta: 25 mm

OTHER FINDINGS:

Mild aortic atherosclerosis. Heart size normal. Central pulmonary
vasculature is unremarkable with respect to caliber and visualized
portions.

Visualized mediastinal structures are unremarkable. No adenopathy in
the visible chest, imaging focused on mid chest and cardiac
structures.

Small nodule at the periphery of the LEFT lung base along the
pleural surface measures 6 x 7 mm (image 38/3) adjacent small nodule
at the LEFT lung base (image 46/3) 4 mm. Visible airways are patent
there is no effusion or consolidation.

Hepatic cysts, no acute findings in the upper abdomen. Largest
presumed cyst is a 2 cm water density well-circumscribed structure
along the margin of the LEFT hepatic lobe.

No acute bone finding. No destructive bone process. Spinal
degenerative changes.
IMPRESSION: Coronary artery calcium score of 141. This places the patient in the
33th percentile for age, gender and race/ethnicity.

Multiple pulmonary nodules. Most severe: 7 mm left solid pulmonary
nodule. Recommend a non-contrast Chest CT at 3-6 months. If patient
is high risk for malignancy, recommend an additional non-contrast
non-contrast Chest CT at 18-24 months is optional. These guidelines
do not apply to immunocompromised patients and patients with cancer.
Follow up in patients with significant comorbidities as clinically
warranted. For lung cancer screening, adhere to Lung-RADS
guidelines. Reference: Radiology. 1155; 284(1):228-43.

Hepatic cysts.

Aortic Atherosclerosis (V6TOC-PJ7.7).

## 2022-08-01 DIAGNOSIS — E559 Vitamin D deficiency, unspecified: Secondary | ICD-10-CM | POA: Diagnosis not present

## 2022-08-01 DIAGNOSIS — Z8546 Personal history of malignant neoplasm of prostate: Secondary | ICD-10-CM | POA: Diagnosis not present

## 2022-08-01 DIAGNOSIS — Z23 Encounter for immunization: Secondary | ICD-10-CM | POA: Diagnosis not present

## 2022-08-01 DIAGNOSIS — K219 Gastro-esophageal reflux disease without esophagitis: Secondary | ICD-10-CM | POA: Diagnosis not present

## 2022-08-01 DIAGNOSIS — I700 Atherosclerosis of aorta: Secondary | ICD-10-CM | POA: Diagnosis not present

## 2022-08-01 DIAGNOSIS — R918 Other nonspecific abnormal finding of lung field: Secondary | ICD-10-CM | POA: Diagnosis not present

## 2022-08-01 DIAGNOSIS — I2510 Atherosclerotic heart disease of native coronary artery without angina pectoris: Secondary | ICD-10-CM | POA: Diagnosis not present

## 2022-08-01 DIAGNOSIS — I10 Essential (primary) hypertension: Secondary | ICD-10-CM | POA: Diagnosis not present

## 2022-08-01 DIAGNOSIS — Z0000 Encounter for general adult medical examination without abnormal findings: Secondary | ICD-10-CM | POA: Diagnosis not present

## 2022-08-08 DIAGNOSIS — I2510 Atherosclerotic heart disease of native coronary artery without angina pectoris: Secondary | ICD-10-CM | POA: Diagnosis not present

## 2022-08-08 DIAGNOSIS — E559 Vitamin D deficiency, unspecified: Secondary | ICD-10-CM | POA: Diagnosis not present

## 2022-10-21 DIAGNOSIS — Z23 Encounter for immunization: Secondary | ICD-10-CM | POA: Diagnosis not present

## 2022-10-22 DIAGNOSIS — D225 Melanocytic nevi of trunk: Secondary | ICD-10-CM | POA: Diagnosis not present

## 2022-10-22 DIAGNOSIS — Z08 Encounter for follow-up examination after completed treatment for malignant neoplasm: Secondary | ICD-10-CM | POA: Diagnosis not present

## 2022-10-22 DIAGNOSIS — Z85828 Personal history of other malignant neoplasm of skin: Secondary | ICD-10-CM | POA: Diagnosis not present

## 2022-10-22 DIAGNOSIS — L821 Other seborrheic keratosis: Secondary | ICD-10-CM | POA: Diagnosis not present

## 2022-10-22 DIAGNOSIS — L570 Actinic keratosis: Secondary | ICD-10-CM | POA: Diagnosis not present

## 2022-10-22 DIAGNOSIS — L814 Other melanin hyperpigmentation: Secondary | ICD-10-CM | POA: Diagnosis not present

## 2022-10-22 DIAGNOSIS — L578 Other skin changes due to chronic exposure to nonionizing radiation: Secondary | ICD-10-CM | POA: Diagnosis not present

## 2022-12-24 DIAGNOSIS — L570 Actinic keratosis: Secondary | ICD-10-CM | POA: Diagnosis not present

## 2023-04-22 DIAGNOSIS — L570 Actinic keratosis: Secondary | ICD-10-CM | POA: Diagnosis not present

## 2023-04-22 DIAGNOSIS — Z85828 Personal history of other malignant neoplasm of skin: Secondary | ICD-10-CM | POA: Diagnosis not present

## 2023-04-22 DIAGNOSIS — Z08 Encounter for follow-up examination after completed treatment for malignant neoplasm: Secondary | ICD-10-CM | POA: Diagnosis not present

## 2023-07-25 DIAGNOSIS — H2513 Age-related nuclear cataract, bilateral: Secondary | ICD-10-CM | POA: Diagnosis not present

## 2023-07-25 DIAGNOSIS — H35033 Hypertensive retinopathy, bilateral: Secondary | ICD-10-CM | POA: Diagnosis not present

## 2023-07-31 DIAGNOSIS — I2510 Atherosclerotic heart disease of native coronary artery without angina pectoris: Secondary | ICD-10-CM | POA: Diagnosis not present

## 2023-07-31 DIAGNOSIS — I10 Essential (primary) hypertension: Secondary | ICD-10-CM | POA: Diagnosis not present

## 2023-07-31 DIAGNOSIS — R7303 Prediabetes: Secondary | ICD-10-CM | POA: Diagnosis not present

## 2023-08-07 ENCOUNTER — Other Ambulatory Visit: Payer: Self-pay | Admitting: Internal Medicine

## 2023-08-07 DIAGNOSIS — E559 Vitamin D deficiency, unspecified: Secondary | ICD-10-CM | POA: Diagnosis not present

## 2023-08-07 DIAGNOSIS — R918 Other nonspecific abnormal finding of lung field: Secondary | ICD-10-CM | POA: Diagnosis not present

## 2023-08-07 DIAGNOSIS — Z8546 Personal history of malignant neoplasm of prostate: Secondary | ICD-10-CM | POA: Diagnosis not present

## 2023-08-07 DIAGNOSIS — I2510 Atherosclerotic heart disease of native coronary artery without angina pectoris: Secondary | ICD-10-CM | POA: Diagnosis not present

## 2023-08-07 DIAGNOSIS — I10 Essential (primary) hypertension: Secondary | ICD-10-CM | POA: Diagnosis not present

## 2023-08-07 DIAGNOSIS — Z0000 Encounter for general adult medical examination without abnormal findings: Secondary | ICD-10-CM | POA: Diagnosis not present

## 2023-08-07 DIAGNOSIS — I700 Atherosclerosis of aorta: Secondary | ICD-10-CM | POA: Diagnosis not present

## 2023-08-07 DIAGNOSIS — R351 Nocturia: Secondary | ICD-10-CM | POA: Diagnosis not present

## 2023-08-07 DIAGNOSIS — R7303 Prediabetes: Secondary | ICD-10-CM | POA: Diagnosis not present

## 2023-08-13 ENCOUNTER — Ambulatory Visit
Admission: RE | Admit: 2023-08-13 | Discharge: 2023-08-13 | Disposition: A | Discharge status: Home / Self Care | Source: Ambulatory Visit | Attending: Internal Medicine | Admitting: Internal Medicine

## 2023-08-13 DIAGNOSIS — R918 Other nonspecific abnormal finding of lung field: Secondary | ICD-10-CM

## 2023-10-07 DIAGNOSIS — N401 Enlarged prostate with lower urinary tract symptoms: Secondary | ICD-10-CM | POA: Diagnosis not present

## 2023-10-07 DIAGNOSIS — Z23 Encounter for immunization: Secondary | ICD-10-CM | POA: Diagnosis not present

## 2023-10-07 DIAGNOSIS — R351 Nocturia: Secondary | ICD-10-CM | POA: Diagnosis not present

## 2023-10-15 DIAGNOSIS — Z23 Encounter for immunization: Secondary | ICD-10-CM | POA: Diagnosis not present

## 2023-10-28 DIAGNOSIS — D1801 Hemangioma of skin and subcutaneous tissue: Secondary | ICD-10-CM | POA: Diagnosis not present

## 2023-10-28 DIAGNOSIS — L570 Actinic keratosis: Secondary | ICD-10-CM | POA: Diagnosis not present

## 2023-10-28 DIAGNOSIS — Z08 Encounter for follow-up examination after completed treatment for malignant neoplasm: Secondary | ICD-10-CM | POA: Diagnosis not present

## 2023-10-28 DIAGNOSIS — L821 Other seborrheic keratosis: Secondary | ICD-10-CM | POA: Diagnosis not present

## 2023-10-28 DIAGNOSIS — L814 Other melanin hyperpigmentation: Secondary | ICD-10-CM | POA: Diagnosis not present

## 2023-10-28 DIAGNOSIS — Z85828 Personal history of other malignant neoplasm of skin: Secondary | ICD-10-CM | POA: Diagnosis not present
